# Patient Record
Sex: Male | Born: 1955 | Race: Black or African American | Hispanic: No | Smoking: Current every day smoker
Health system: Southern US, Community
[De-identification: ages and names within clinical notes are randomized; demographics above are authoritative.]

## PROBLEM LIST (undated history)

## (undated) DIAGNOSIS — E785 Hyperlipidemia, unspecified: Secondary | ICD-10-CM

## (undated) DIAGNOSIS — M1990 Unspecified osteoarthritis, unspecified site: Secondary | ICD-10-CM

## (undated) DIAGNOSIS — I2510 Atherosclerotic heart disease of native coronary artery without angina pectoris: Secondary | ICD-10-CM

## (undated) DIAGNOSIS — F329 Major depressive disorder, single episode, unspecified: Secondary | ICD-10-CM

## (undated) DIAGNOSIS — T7840XA Allergy, unspecified, initial encounter: Secondary | ICD-10-CM

## (undated) DIAGNOSIS — G894 Chronic pain syndrome: Secondary | ICD-10-CM

## (undated) DIAGNOSIS — I10 Essential (primary) hypertension: Secondary | ICD-10-CM

## (undated) DIAGNOSIS — F32A Depression, unspecified: Secondary | ICD-10-CM

## (undated) HISTORY — DX: Essential (primary) hypertension: I10

## (undated) HISTORY — DX: Chronic pain syndrome: G89.4

## (undated) HISTORY — PX: CARDIAC CATHETERIZATION: SHX172

## (undated) HISTORY — PX: JOINT REPLACEMENT: SHX530

## (undated) HISTORY — PX: CORONARY ANGIOPLASTY WITH STENT PLACEMENT: SHX49

## (undated) HISTORY — DX: Hyperlipidemia, unspecified: E78.5

## (undated) HISTORY — DX: Allergy, unspecified, initial encounter: T78.40XA

## (undated) HISTORY — DX: Major depressive disorder, single episode, unspecified: F32.9

## (undated) HISTORY — DX: Unspecified osteoarthritis, unspecified site: M19.90

## (undated) HISTORY — PX: SPINE SURGERY: SHX786

## (undated) HISTORY — PX: HEMORRHOID SURGERY: SHX153

## (undated) HISTORY — PX: BACK SURGERY: SHX140

## (undated) HISTORY — DX: Depression, unspecified: F32.A

---

## 1998-08-04 ENCOUNTER — Ambulatory Visit (HOSPITAL_COMMUNITY): Admission: RE | Admit: 1998-08-04 | Discharge: 1998-08-04 | Payer: Self-pay | Admitting: Pulmonary Disease

## 1998-08-04 ENCOUNTER — Encounter: Payer: Self-pay | Admitting: Pulmonary Disease

## 1998-09-30 ENCOUNTER — Emergency Department (HOSPITAL_COMMUNITY): Admission: EM | Admit: 1998-09-30 | Discharge: 1998-09-30 | Payer: Self-pay | Admitting: Emergency Medicine

## 1998-09-30 ENCOUNTER — Encounter: Payer: Self-pay | Admitting: Emergency Medicine

## 1999-03-11 ENCOUNTER — Ambulatory Visit (HOSPITAL_COMMUNITY): Admission: RE | Admit: 1999-03-11 | Discharge: 1999-03-11 | Payer: Self-pay | Admitting: Pulmonary Disease

## 1999-03-11 ENCOUNTER — Encounter: Payer: Self-pay | Admitting: Pulmonary Disease

## 2001-09-02 ENCOUNTER — Emergency Department (HOSPITAL_COMMUNITY): Admission: EM | Admit: 2001-09-02 | Discharge: 2001-09-02 | Payer: Self-pay | Admitting: Emergency Medicine

## 2001-11-27 ENCOUNTER — Emergency Department (HOSPITAL_COMMUNITY): Admission: EM | Admit: 2001-11-27 | Discharge: 2001-11-27 | Payer: Self-pay | Admitting: Emergency Medicine

## 2002-07-04 ENCOUNTER — Emergency Department (HOSPITAL_COMMUNITY): Admission: EM | Admit: 2002-07-04 | Discharge: 2002-07-04 | Payer: Self-pay | Admitting: Emergency Medicine

## 2002-07-04 ENCOUNTER — Encounter: Payer: Self-pay | Admitting: Emergency Medicine

## 2002-07-09 ENCOUNTER — Ambulatory Visit (HOSPITAL_COMMUNITY): Admission: RE | Admit: 2002-07-09 | Discharge: 2002-07-09 | Payer: Self-pay | Admitting: Internal Medicine

## 2002-07-09 ENCOUNTER — Encounter: Payer: Self-pay | Admitting: Internal Medicine

## 2002-07-14 ENCOUNTER — Inpatient Hospital Stay (HOSPITAL_COMMUNITY): Admission: EM | Admit: 2002-07-14 | Discharge: 2002-07-23 | Payer: Self-pay | Admitting: *Deleted

## 2002-07-14 ENCOUNTER — Emergency Department (HOSPITAL_COMMUNITY): Admission: EM | Admit: 2002-07-14 | Discharge: 2002-07-14 | Payer: Self-pay | Admitting: Emergency Medicine

## 2002-07-14 ENCOUNTER — Encounter: Payer: Self-pay | Admitting: Neurosurgery

## 2002-07-18 ENCOUNTER — Encounter: Payer: Self-pay | Admitting: Neurosurgery

## 2002-07-19 ENCOUNTER — Encounter: Payer: Self-pay | Admitting: Neurosurgery

## 2002-07-23 ENCOUNTER — Encounter: Payer: Self-pay | Admitting: Neurosurgery

## 2002-07-25 ENCOUNTER — Emergency Department (HOSPITAL_COMMUNITY): Admission: EM | Admit: 2002-07-25 | Discharge: 2002-07-25 | Payer: Self-pay | Admitting: Emergency Medicine

## 2002-08-24 DIAGNOSIS — Z9889 Other specified postprocedural states: Secondary | ICD-10-CM | POA: Insufficient documentation

## 2002-11-15 ENCOUNTER — Encounter: Admission: RE | Admit: 2002-11-15 | Discharge: 2002-11-15 | Payer: Self-pay | Admitting: Neurosurgery

## 2002-11-15 ENCOUNTER — Encounter: Payer: Self-pay | Admitting: Neurosurgery

## 2003-01-10 DIAGNOSIS — M810 Age-related osteoporosis without current pathological fracture: Secondary | ICD-10-CM | POA: Insufficient documentation

## 2003-02-07 DIAGNOSIS — F411 Generalized anxiety disorder: Secondary | ICD-10-CM | POA: Insufficient documentation

## 2003-03-09 ENCOUNTER — Ambulatory Visit (HOSPITAL_COMMUNITY): Admission: RE | Admit: 2003-03-09 | Discharge: 2003-03-09 | Payer: Self-pay | Admitting: Neurosurgery

## 2003-03-25 ENCOUNTER — Encounter: Admission: RE | Admit: 2003-03-25 | Discharge: 2003-03-25 | Payer: Self-pay | Admitting: Neurosurgery

## 2003-04-08 ENCOUNTER — Encounter: Admission: RE | Admit: 2003-04-08 | Discharge: 2003-04-08 | Payer: Self-pay | Admitting: Neurosurgery

## 2003-05-02 ENCOUNTER — Ambulatory Visit (HOSPITAL_COMMUNITY): Admission: RE | Admit: 2003-05-02 | Discharge: 2003-05-02 | Payer: Self-pay | Admitting: Neurosurgery

## 2003-05-10 DIAGNOSIS — F528 Other sexual dysfunction not due to a substance or known physiological condition: Secondary | ICD-10-CM

## 2004-09-09 ENCOUNTER — Encounter: Admission: RE | Admit: 2004-09-09 | Discharge: 2004-09-09 | Payer: Self-pay | Admitting: Family Medicine

## 2005-04-25 DIAGNOSIS — Z96659 Presence of unspecified artificial knee joint: Secondary | ICD-10-CM

## 2005-10-17 ENCOUNTER — Encounter: Admission: RE | Admit: 2005-10-17 | Discharge: 2005-10-17 | Payer: Self-pay | Admitting: Endocrinology

## 2005-11-23 ENCOUNTER — Ambulatory Visit (HOSPITAL_COMMUNITY): Admission: RE | Admit: 2005-11-23 | Discharge: 2005-11-23 | Payer: Self-pay | Admitting: Orthopedic Surgery

## 2006-01-12 ENCOUNTER — Ambulatory Visit (HOSPITAL_BASED_OUTPATIENT_CLINIC_OR_DEPARTMENT_OTHER): Admission: RE | Admit: 2006-01-12 | Discharge: 2006-01-12 | Payer: Self-pay | Admitting: Orthopedic Surgery

## 2006-07-26 ENCOUNTER — Ambulatory Visit (HOSPITAL_COMMUNITY): Admission: RE | Admit: 2006-07-26 | Discharge: 2006-07-26 | Payer: Self-pay | Admitting: Orthopedic Surgery

## 2006-08-30 ENCOUNTER — Ambulatory Visit (HOSPITAL_BASED_OUTPATIENT_CLINIC_OR_DEPARTMENT_OTHER): Admission: RE | Admit: 2006-08-30 | Discharge: 2006-08-30 | Payer: Self-pay | Admitting: Orthopedic Surgery

## 2007-03-19 ENCOUNTER — Inpatient Hospital Stay (HOSPITAL_COMMUNITY): Admission: RE | Admit: 2007-03-19 | Discharge: 2007-03-25 | Payer: Self-pay | Admitting: Orthopedic Surgery

## 2007-07-17 ENCOUNTER — Emergency Department (HOSPITAL_COMMUNITY): Admission: EM | Admit: 2007-07-17 | Discharge: 2007-07-17 | Payer: Self-pay | Admitting: Emergency Medicine

## 2007-07-30 ENCOUNTER — Encounter (INDEPENDENT_AMBULATORY_CARE_PROVIDER_SITE_OTHER): Payer: Self-pay | Admitting: Nurse Practitioner

## 2007-07-30 LAB — CONVERTED CEMR LAB
Cholesterol: 217 mg/dL
HDL: 53 mg/dL
LDL Cholesterol: 134 mg/dL
Triglycerides: 150 mg/dL

## 2007-07-31 ENCOUNTER — Encounter (INDEPENDENT_AMBULATORY_CARE_PROVIDER_SITE_OTHER): Payer: Self-pay | Admitting: Nurse Practitioner

## 2007-07-31 LAB — CONVERTED CEMR LAB
ALT: 28 units/L
Calcium: 9.5 mg/dL
Total Bilirubin: 0.4 mg/dL
Total Protein: 7.4 g/dL

## 2007-10-04 ENCOUNTER — Encounter
Admission: RE | Admit: 2007-10-04 | Discharge: 2007-10-05 | Payer: Self-pay | Admitting: Physical Medicine & Rehabilitation

## 2007-10-05 ENCOUNTER — Ambulatory Visit: Payer: Self-pay | Admitting: Physical Medicine & Rehabilitation

## 2008-01-02 ENCOUNTER — Emergency Department (HOSPITAL_COMMUNITY): Admission: EM | Admit: 2008-01-02 | Discharge: 2008-01-02 | Payer: Self-pay | Admitting: Emergency Medicine

## 2008-06-14 ENCOUNTER — Emergency Department (HOSPITAL_COMMUNITY): Admission: EM | Admit: 2008-06-14 | Discharge: 2008-06-14 | Payer: Self-pay | Admitting: Emergency Medicine

## 2008-06-18 ENCOUNTER — Ambulatory Visit: Payer: Self-pay | Admitting: Nurse Practitioner

## 2008-06-18 DIAGNOSIS — F17200 Nicotine dependence, unspecified, uncomplicated: Secondary | ICD-10-CM | POA: Insufficient documentation

## 2008-06-18 DIAGNOSIS — G479 Sleep disorder, unspecified: Secondary | ICD-10-CM | POA: Insufficient documentation

## 2008-06-18 DIAGNOSIS — M549 Dorsalgia, unspecified: Secondary | ICD-10-CM | POA: Insufficient documentation

## 2008-07-01 ENCOUNTER — Encounter (INDEPENDENT_AMBULATORY_CARE_PROVIDER_SITE_OTHER): Payer: Self-pay | Admitting: Nurse Practitioner

## 2008-07-09 ENCOUNTER — Inpatient Hospital Stay (HOSPITAL_COMMUNITY): Admission: EM | Admit: 2008-07-09 | Discharge: 2008-07-12 | Payer: Self-pay | Admitting: Emergency Medicine

## 2008-07-09 ENCOUNTER — Ambulatory Visit: Payer: Self-pay | Admitting: Family Medicine

## 2008-07-11 DIAGNOSIS — K219 Gastro-esophageal reflux disease without esophagitis: Secondary | ICD-10-CM

## 2008-07-11 DIAGNOSIS — R1310 Dysphagia, unspecified: Secondary | ICD-10-CM | POA: Insufficient documentation

## 2008-07-18 ENCOUNTER — Encounter (INDEPENDENT_AMBULATORY_CARE_PROVIDER_SITE_OTHER): Payer: Self-pay | Admitting: Nurse Practitioner

## 2008-07-21 ENCOUNTER — Ambulatory Visit (HOSPITAL_COMMUNITY): Admission: RE | Admit: 2008-07-21 | Discharge: 2008-07-21 | Payer: Self-pay | Admitting: Nephrology

## 2008-08-11 ENCOUNTER — Encounter (INDEPENDENT_AMBULATORY_CARE_PROVIDER_SITE_OTHER): Payer: Self-pay | Admitting: Nurse Practitioner

## 2008-08-14 ENCOUNTER — Ambulatory Visit (HOSPITAL_COMMUNITY): Admission: RE | Admit: 2008-08-14 | Discharge: 2008-08-14 | Payer: Self-pay | Admitting: Neurosurgery

## 2008-08-20 ENCOUNTER — Encounter (INDEPENDENT_AMBULATORY_CARE_PROVIDER_SITE_OTHER): Payer: Self-pay | Admitting: Nurse Practitioner

## 2008-08-20 DIAGNOSIS — E7800 Pure hypercholesterolemia, unspecified: Secondary | ICD-10-CM | POA: Insufficient documentation

## 2008-08-21 ENCOUNTER — Encounter (INDEPENDENT_AMBULATORY_CARE_PROVIDER_SITE_OTHER): Payer: Self-pay | Admitting: Nurse Practitioner

## 2008-08-25 ENCOUNTER — Encounter: Admission: RE | Admit: 2008-08-25 | Discharge: 2008-09-09 | Payer: Self-pay | Admitting: Neurosurgery

## 2008-09-01 ENCOUNTER — Ambulatory Visit: Payer: Self-pay | Admitting: Nurse Practitioner

## 2008-09-01 DIAGNOSIS — E669 Obesity, unspecified: Secondary | ICD-10-CM | POA: Insufficient documentation

## 2008-09-10 ENCOUNTER — Encounter (INDEPENDENT_AMBULATORY_CARE_PROVIDER_SITE_OTHER): Payer: Self-pay | Admitting: Nurse Practitioner

## 2008-09-15 ENCOUNTER — Ambulatory Visit: Payer: Self-pay | Admitting: Nurse Practitioner

## 2008-09-15 LAB — CONVERTED CEMR LAB
ALT: 24 units/L (ref 0–53)
Albumin: 4.1 g/dL (ref 3.5–5.2)
Basophils Relative: 1 % (ref 0–1)
CO2: 24 meq/L (ref 19–32)
Cholesterol: 200 mg/dL (ref 0–200)
Glucose, Bld: 105 mg/dL — ABNORMAL HIGH (ref 70–99)
Hemoglobin: 14 g/dL (ref 13.0–17.0)
LDL Cholesterol: 125 mg/dL — ABNORMAL HIGH (ref 0–99)
Lymphocytes Relative: 34 % (ref 12–46)
Lymphs Abs: 1.4 10*3/uL (ref 0.7–4.0)
MCHC: 32.3 g/dL (ref 30.0–36.0)
Monocytes Absolute: 0.6 10*3/uL (ref 0.1–1.0)
Monocytes Relative: 15 % — ABNORMAL HIGH (ref 3–12)
Neutro Abs: 1.8 10*3/uL (ref 1.7–7.7)
Neutrophils Relative %: 43 % (ref 43–77)
Potassium: 4.1 meq/L (ref 3.5–5.3)
RBC: 4.63 M/uL (ref 4.22–5.81)
Sodium: 142 meq/L (ref 135–145)
Total Bilirubin: 0.2 mg/dL — ABNORMAL LOW (ref 0.3–1.2)
Total Protein: 6.6 g/dL (ref 6.0–8.3)
Triglycerides: 93 mg/dL (ref <150)
VLDL: 19 mg/dL (ref 0–40)
WBC: 4.2 10*3/uL (ref 4.0–10.5)

## 2008-09-16 ENCOUNTER — Encounter (INDEPENDENT_AMBULATORY_CARE_PROVIDER_SITE_OTHER): Payer: Self-pay | Admitting: Nurse Practitioner

## 2008-09-29 ENCOUNTER — Encounter (INDEPENDENT_AMBULATORY_CARE_PROVIDER_SITE_OTHER): Payer: Self-pay | Admitting: Nurse Practitioner

## 2008-10-07 ENCOUNTER — Encounter: Admission: RE | Admit: 2008-10-07 | Discharge: 2008-10-07 | Payer: Self-pay | Admitting: Neurosurgery

## 2008-10-09 ENCOUNTER — Telehealth (INDEPENDENT_AMBULATORY_CARE_PROVIDER_SITE_OTHER): Payer: Self-pay | Admitting: Nurse Practitioner

## 2008-10-29 ENCOUNTER — Encounter (INDEPENDENT_AMBULATORY_CARE_PROVIDER_SITE_OTHER): Payer: Self-pay | Admitting: Nurse Practitioner

## 2008-11-21 ENCOUNTER — Encounter (INDEPENDENT_AMBULATORY_CARE_PROVIDER_SITE_OTHER): Payer: Self-pay | Admitting: Nurse Practitioner

## 2009-02-02 ENCOUNTER — Ambulatory Visit: Payer: Self-pay | Admitting: Nurse Practitioner

## 2009-02-04 LAB — CONVERTED CEMR LAB
Cholesterol: 224 mg/dL — ABNORMAL HIGH (ref 0–200)
Total CHOL/HDL Ratio: 4.2
Triglycerides: 95 mg/dL (ref <150)

## 2009-05-13 ENCOUNTER — Encounter (INDEPENDENT_AMBULATORY_CARE_PROVIDER_SITE_OTHER): Payer: Self-pay | Admitting: Nurse Practitioner

## 2009-05-28 ENCOUNTER — Encounter (INDEPENDENT_AMBULATORY_CARE_PROVIDER_SITE_OTHER): Payer: Self-pay | Admitting: Nurse Practitioner

## 2010-04-30 ENCOUNTER — Encounter (INDEPENDENT_AMBULATORY_CARE_PROVIDER_SITE_OTHER): Payer: Self-pay | Admitting: Nurse Practitioner

## 2010-05-15 ENCOUNTER — Encounter: Payer: Self-pay | Admitting: Neurosurgery

## 2010-05-16 ENCOUNTER — Encounter: Payer: Self-pay | Admitting: Family Medicine

## 2010-05-16 ENCOUNTER — Encounter: Payer: Self-pay | Admitting: Endocrinology

## 2010-05-25 NOTE — Letter (Signed)
Summary: POS-T-VAC//DENIED//FAXED  POS-T-VAC//DENIED//FAXED   Imported By: Arta Bruce 05/28/2009 09:39:10  _____________________________________________________________________  External Attachment:    Type:   Image     Comment:   External Document

## 2010-05-25 NOTE — Miscellaneous (Signed)
Summary: Med change  Clinical Lists Changes  Medications: Changed medication from PERCOCET 7.5-325 MG TABS (OXYCODONE-ACETAMINOPHEN) 1 tablet by mouth two times a day as needed for pain to PERCOCET 7.5-325 MG TABS (OXYCODONE-ACETAMINOPHEN) NO MORE NARCS IN THIS OFFICE

## 2010-05-27 NOTE — Letter (Signed)
Summary: [facility]  [facility]   Imported By: Arta Bruce 05/07/2010 15:04:00  _____________________________________________________________________  External Attachment:    Type:   Image     Comment:   External Document

## 2010-08-05 LAB — BASIC METABOLIC PANEL
BUN: 19 mg/dL (ref 6–23)
Calcium: 9.6 mg/dL (ref 8.4–10.5)
Creatinine, Ser: 1.14 mg/dL (ref 0.4–1.5)
GFR calc Af Amer: >60 mL/min (ref >60)
GFR calc non Af Amer: >60 mL/min (ref >60)
GFR calc non Af Amer: >60 mL/min (ref >60)
Potassium: 3.7 mEq/L (ref 3.5–5.1)
Sodium: 136 mEq/L (ref 135–145)

## 2010-08-05 LAB — LIPID PANEL
LDL Cholesterol: 119 mg/dL — ABNORMAL HIGH (ref 0–99)
Triglycerides: 64 mg/dL (ref <150)
VLDL: 13 mg/dL (ref 0–40)

## 2010-08-05 LAB — CBC
HCT: 38.7 % — ABNORMAL LOW (ref 39.0–52.0)
Hemoglobin: 13.3 g/dL (ref 13.0–17.0)
MCHC: 34.3 g/dL (ref 30.0–36.0)
MCV: 93.8 fL (ref 78.0–100.0)
Platelets: 185 10*3/uL (ref 150–400)
Platelets: 238 10*3/uL (ref 150–400)
RBC: 4.77 MIL/uL (ref 4.22–5.81)
RDW: 13.2 % (ref 11.5–15.5)
WBC: 8 10*3/uL (ref 4.0–10.5)

## 2010-08-05 LAB — RAPID URINE DRUG SCREEN, HOSP PERFORMED
Amphetamines: NOT DETECTED
Cocaine: POSITIVE — AB
Opiates: NOT DETECTED
Tetrahydrocannabinol: NOT DETECTED

## 2010-08-05 LAB — DIFFERENTIAL
Eosinophils Absolute: 0 10*3/uL (ref 0.0–0.7)
Lymphocytes Relative: 13 % (ref 12–46)
Lymphs Abs: 1 10*3/uL (ref 0.7–4.0)
Neutro Abs: 6.2 10*3/uL (ref 1.7–7.7)
Neutrophils Relative %: 78 % — ABNORMAL HIGH (ref 43–77)

## 2010-08-05 LAB — HEPARIN LEVEL (UNFRACTIONATED): Heparin Unfractionated: 0.32 IU/mL (ref 0.30–0.70)

## 2010-08-05 LAB — COMPREHENSIVE METABOLIC PANEL
Albumin: 3.5 g/dL (ref 3.5–5.2)
BUN: 16 mg/dL (ref 6–23)
Chloride: 106 mEq/L (ref 96–112)
Creatinine, Ser: 0.97 mg/dL (ref 0.4–1.5)
GFR calc non Af Amer: >60 mL/min (ref >60)
Total Bilirubin: 0.8 mg/dL (ref 0.3–1.2)

## 2010-08-05 LAB — CARDIAC PANEL(CRET KIN+CKTOT+MB+TROPI)
CK, MB: 2 ng/mL (ref 0.3–4.0)
Relative Index: 1.6 (ref 0.0–2.5)
Relative Index: 2.1 (ref 0.0–2.5)
Relative Index: 2.8 — ABNORMAL HIGH (ref 0.0–2.5)
Total CK: 129 U/L (ref 7–232)
Total CK: 214 U/L (ref 7–232)
Troponin I: <0.01 ng/mL (ref 0.00–0.06)
Troponin I: <0.01 ng/mL (ref 0.00–0.06)

## 2010-08-05 LAB — POCT CARDIAC MARKERS
CKMB, poc: 8 ng/mL (ref 1.0–8.0)
Myoglobin, poc: 221 ng/mL (ref 12–200)
Troponin i, poc: <0.05 ng/mL (ref 0.00–0.09)

## 2010-08-05 LAB — TSH: TSH: 0.366 u[IU]/mL (ref 0.350–4.500)

## 2010-08-05 LAB — ETHANOL: Alcohol, Ethyl (B): <5 mg/dL (ref 0–10)

## 2010-08-05 LAB — HIV ANTIBODY (ROUTINE TESTING W REFLEX): HIV: NONREACTIVE

## 2010-09-07 NOTE — H&P (Signed)
NAME:  Jeremiah Goodwin, Jeremiah Goodwin NO.:  [id number]   MEDICAL RECORD NO.:  [id number]          PATIENT TYPE:  INP   LOCATION:  2024                         FACILITY:  [facility]   PHYSICIAN:  Leighton Roach McDiarmid, M.D.DATE OF BIRTH:  1956/03/09   DATE OF ADMISSION:  07/09/2008  DATE OF DISCHARGE:                              HISTORY & PHYSICAL   PRIMARY CARE PHYSICIAN:  [facility].   PRIMARY ORTHOPEDICS:  [facility].   PRIMARY PAIN DOCTOR:  Dr. Marca Ancona.   PRIMARY NEUROSURGEON:  Danae Orleans. Venetia Maxon, M.D.   CHIEF COMPLAINT:  Chest pain.   HISTORY OF PRESENT ILLNESS:  This is a 55 year old male who began having  chest pain at approximately 2-3 a.m. on the morning of admission, but  then did not tell his wife until approximately 10:45 a.m., who called  EMS.  Chest pain is described as heavy weights and pressure.  It is  substernal in location.  It has not been relieved by nitroglycerin and  morphine in the ER.  He had been working hard, moving into a new  property, just prior to the pain and was resting on the floor when the  pain started.  He had never had pain like this before.  He tried  changing physicians to relieve his pain, but did not have any relief.  He did say lying flat made it worse, however.  He did have some nausea  and vomiting associated with the pain and felt cold, but denied any  diaphoresis or sweating.  The pain did not radiate anywhere.  It was not  reportedly associated with the food.   PAST MEDICAL HISTORY:  1. Back pain, chronic.  2. Osteoporosis.  3. GERD.  4. Hyperlipidemia.  5. Insomnia.  6. Leg pain.   PAST SURGICAL HISTORY:  Lumbar fusion in 1990s, cervical fusion in the  2000s, decompressive laminectomy in 2004 of the lumbar spine, a left  total knee replacement, and multiple knee surgeries.   SOCIAL HISTORY:  The patient currently lives with his ex-wife.  He  smokes 1-1/2 packs per day and has since approximately age 40.  He  drinks fairly heavily, drinking about half-pint to a pint on most days  of the week and occasionally a beer with that.  He initially denied drug  use, but once his wife left he did admit to cocaine use.  He has been on  disability since 2005 due to his chronic back pain.  He has 2 children  himself and he has 4 children through his wife.   FAMILY HISTORY:  His mother had type 2 diabetes and breast cancer.  His  father was an alcoholic.  In his siblings, 1 has type 2 diabetes.   CURRENT MEDICATIONS:  1. Endocet 5/325 p.o. t.i.d. per Dr. Marca Ancona.  2. Lovaza 1 g p.o. q.i.d.  3. Calcium plus vitamin D 600/400 p.o. daily.  4. Vitamin D 2000 International Units p.o. daily.  5. Multivitamin p.o. daily.  6. Ibuprofen 200 mg p.o. p.r.n.  7. Percocet 7.5/325 p.o. b.i.d. p.r.n.  8. Amitriptyline 25 mg p.o. nightly.  9. Neurontin 300 mg p.o. t.i.d.  10.Boniva as directed.  11.Tizanidine 4 mg p.o. t.i.d.   ALLERGIES:  VICODIN CAUSES ITCHING.   REVIEW OF SYSTEMS:  Positive for HPI and for increasing pain with hiccup  and sneeze and increasing shortness of breath for the past few days.  Otherwise as per HPI or is negative greater than 12 points.   PHYSICAL EXAMINATION:  VITAL SIGNS:  Temperature 97.4, pulse 97-110,  respirations 19, blood pressure 123-132/60s-70s, pulse ox 97% on room  air.  GENERAL:  This is an obese, African American male, lying on the bed,  wincing occasionally in pain.  HEENT:  Normocephalic, atraumatic with pupils equal, round, reactive to  light.  Extraocular muscles intact.  Moist mucous membranes.  No  pharyngeal erythema, edema, or exudates.  No lymphadenopathy.  CARDIOVASCULAR:  Normal S1 and S2 with no murmurs, rubs, or gallops.  LUNGS:  Crackles at the right lower lobe, but is otherwise clear with  acute breath in all lung fields.  ABDOMEN:  Soft, nontender except for an epigastric region.  There are no  masses palpable.  No hepatosplenomegaly palpable.  There  are positive  bowel sounds.  BACK:  Incisions from previous surgeries to cervical spine and lumbar  spine.  EXTREMITIES:  No edema and 2+ peripheral pulses.  NEUROLOGIC:  Cranial nerves II through XII are grossly intact, and  otherwise neurologic exam is grossly intact.  SKIN:  No rashes or jaundice.   LABORATORY DATA:  Point of care cardiac enzymes reveal CK-MB 8, troponin  I less than 0.05, myoglobin 22.9.  CBC reveals white blood cell count of  8.0, hemoglobin 15.1, hematocrit 44.4, platelet count 238 with 78%  neutrophils.  UDS is positive for cocaine.  BMET reveals sodium 140,  potassium 4.1, chloride 105, bicarb 22, BUN 19, creatinine 1.14, glucose  90, and calcium 9.6.   STUDIES:  Chest x-ray reveals likely atelectasis, which is new in the  left base.  EKG is unchanged from 2008 and shows normal sinus rhythm  with poor R-wave progression, right axis deviation, and no T-wave or ST-  segment abnormalities.   ASSESSMENT:  This is a 55 year old gentleman with history of chronic  back pain and tobacco abuse, who presents with chest pain.   PLAN:  1. Chest pain.  This appears to be atypical angina at this point.  We      will admit to rule out acute MI.  We will cycle his cardiac enzymes      x2 more sets every 8 hours apart and repeat an EKG in the a.m.  We      will place on telemetry to monitor for any arrhythmias.  His risk      factors include his smoking and possible hyperlipidemia since he is      on Lovaza.  We will stratify him with an A1c, fasting lipid panel,      and TSH.  His UDS was positive for cocaine, and he was confronted      about this in the emergency room and consulted on the fact that he      needs to abstain.  If more risk factors develop, he may require an      outpatient stress test.  If he needs a cardiologist during this      hospital stay, he would prefer [facility].  Because this      chest pain is atypical, we will try treating it as if it  is a GI      process as well with a GI cocktail and PPI.  2. Polysubstance abuse.  He has positive UDS for cocaine and he      endorses one and half packs per day of cigarettes and greater than      1 pink of drink the night before his admission.  He denies any      history of binging or DTs.  We will provide cessation counseling      for all of the above for the patient.  3. Chronic back pain.  We will continue Percocet, Neurontin, and      morphine.  These are per his home regimen with the exception of the      morphine, which will also help if this is true chest pain from a      cardiac standpoint.  Given that we are ruling out cardiac      standpoint, we will hold on the nicotine patch while he is in      house.  4. Question of hyperlipidemia.  We will continue his Lovaza and check      a fasting lipid panel.  5. Fluid, electrolytes, nutrition, and gastrointestinal.  We will Hep-      Lock his peripheral IV and provide heart-healthy diet.  6. Prophylaxis.  We will provide Protonix and heparin.  7. Disposition is hopeful for about 24-hour observation if there is a      complete rule out.      Ancil Boozer, MD  Electronically Signed      Leighton Roach McDiarmid, M.D.  Electronically Signed    SA/MEDQ  D:  07/11/2008  T:  07/12/2008  Job:  [id number]

## 2010-09-07 NOTE — Consult Note (Signed)
NAME:  Jeremiah Goodwin, RICHES NO.:  [id number]   MEDICAL RECORD NO.:  [id number]          PATIENT TYPE:  INP   LOCATION:  2024                         FACILITY:  [facility]   PHYSICIAN:  John C. Madilyn Fireman, M.D.    DATE OF BIRTH:  16-Jan-1956   DATE OF CONSULTATION:  07/11/2008  DATE OF DISCHARGE:                                 CONSULTATION   REASON FOR CONSULTATION:  Chest pain and odynophagia.   HISTORY OF PRESENT ILLNESS:  The patient is a 55 year old white male who  presents with the onset of chest pain which, since his hospitalization,  has transitioned over more to odynophagia for liquids and solids with  resolution of constant chest pain.  He gives me a history of having had  some exposure to Easy-Off oven cleaner and Clorox after work, also using  cocaine and alcohol and at some point after that stated he could not  move his legs and sat on the floor overnight, could not reach his cell  phone and sometime during that time began having his chest pain.  He  eventually reached somebody and EMS brought him to the hospital where he  has had a cardiac consultation with negative cardiac etiology found.  During the time he has been here his symptoms have transitioned more to  odynophagia in the upper retrosternal area, this occurs for both solids  and liquids.  This overall seems to be slightly improved.  He has been  started on a proton pump inhibitor since he has been here.  He has never  had any similar symptoms and denies any antecedent gastroesophageal  reflux.  He denies taking any particular pills incriminated in causing  pill-induced esophagitis such as doxycycline, ibuprofen or  bisphosphonate.   PAST MEDICAL HISTORY:  1. Osteoporosis.  2. Hyperlipidemia.   PAST SURGICAL HISTORY:  1. Lumbar fusion in the 1990s.  2. Decompressive lumbar laminectomy.  3. Knee replacement.   FAMILY HISTORY:  Noncontributory.   SOCIAL HISTORY:  Uses cocaine.  Drinks some alcohol.   Lives with wife  and 2 children.   PHYSICAL EXAM:  A well-developed, well-nourished pleasant black male in  no acute distress.  HEART:  Regular rate and rhythm, without murmur.  LUNGS:  Clear.  ABDOMEN:  Soft, nondistended with normoactive bowel sounds.  No  hepatosplenomegaly, mass or guarding.  OROPHARYNGEAL:  Unremarkable.   IMPRESSION:  Chest pain, with unusual symptoms at onset, transitioning  over to odynophagia, etiology not apparent.   PLAN:  1. Continue proton pump inhibitor.  2. Will add Carafate elixir and if symptoms do not improve steadily      consider esophagoscopy.           ______________________________  Everardo All. Madilyn Fireman, M.D.     JCH/MEDQ  D:  07/11/2008  T:  07/11/2008  Job:  [id number]   cc:   Leighton Roach McDiarmid, M.D.

## 2010-09-07 NOTE — H&P (Signed)
NAME:  Jeremiah Goodwin, Jeremiah Goodwin NO.:  [id number]   MEDICAL RECORD NO.:  [id number]          PATIENT TYPE:  INP   LOCATION:  NA                           FACILITY:  [facility]   PHYSICIAN:  Ollen Gross, M.D.    DATE OF BIRTH:  [birth year]   DATE OF ADMISSION:  03/19/2007  DATE OF DISCHARGE:                              HISTORY & PHYSICAL   DATE OF OFFICE VISIT AND HISTORY AND PHYSICAL:  March 15, 2007.   CHIEF COMPLAINT:  Left knee pain.   HISTORY OF PRESENT ILLNESS:  The patient is a 55 year old male who has  been seen by Dr. Lequita Halt for ongoing knee problems.  He has known  arthritis.  He has been treated conservatively in the past for his knee  problems with injections.  He also actually has undergone a left knee  arthroscopy earlier this year without benefit.  He has reached a point  now where he could benefit from undergoing surgical intervention.  Risks  and benefits have been discussed, and he elected to proceed with  surgery.   ALLERGIES:  VICODIN causes itching (the patient is able to take  Percocet).   CURRENT MEDICATIONS:  1. Percocet 10.  2. Lipitor.  3. Fish oil.  4. Vitamin D.  5. Boniva.  6. Aleve.   PAST MEDICAL HISTORY:  Elevated cholesterol.   PAST SURGICAL HISTORY:  1. Lumbar surgery 6 or 7 times.  2. Left knee surgery x2.   SOCIAL HISTORY:  One-half pack per day smoker.  One pint of alcohol per  week.  Four children, two stepchildren.  Lives with his mother.  Bedroom  is upstairs.  Hs a full flight of stairs going up.   FAMILY HISTORY:  Diabetes and cancer.   REVIEW OF SYSTEMS:  GENERAL:  No fevers, chills, or night sweats.  NEUROLOGIC:  No seizures, syncope, or paralysis.  RESPIRATORY:  No  shortness of breath, productive cough, or hemoptysis.  CARDIOVASCULAR:  No chest pain, angina, or orthopnea.  GI:  No nausea, vomiting,  diarrhea, or constipation.  GU: No dysuria, hematuria, or discharge.  MUSCULOSKELETAL:  Left knee.   PHYSICAL  EXAMINATION:  VITAL SIGNS:  Pulse 68, respirations 12, blood  pressure 118/78.  GENERAL:  A 55 year old [place] American male, well nourished, well  developed, in no acute distress, alert and cooperative.  He is  accompanied by his mother.  HEENT:  Normocephalic, atraumatic.  Pupils round, reactive.  Oropharynx  clear.  EOMs intact.  NECK:  Supple.  CHEST:  Clear anterior posterior chest walls.  No rhonchi, rales, or  wheezing.  HEART:  Regular rate and rhythm.  No murmur.  S1, S2 noted.  ABDOMEN:  Soft, nontender.  Bowel sounds present, slightly round.  RECTAL/BREASTS/GENITALIA:  Not done.  Not pertinent to present illness.  EXTREMITIES:  Left knee shows moderate fusion.  Motor function intact.  Sensation intact.   IMPRESSION:  Osteoarthritis left knee.   PLAN:  The patient will be admitted to [facility] to go a left  total knee replacement arthroplasty.  Surgery will be  performed by Dr.  Ollen Gross.      Alexzandrew L. Perkins, P.A.C.      Ollen Gross, M.D.  Electronically Signed    ALP/MEDQ  D:  03/18/2007  T:  03/19/2007  Job:  [id number]   cc:   Alexia Freestone, M.D.

## 2010-09-07 NOTE — Group Therapy Note (Signed)
[name] is a 55 year old male.  Consult requested for the  evaluation of chronic low back pain as well as knee pain.   CHIEF COMPLAINT:  Back pain, lower extremity pain, as well as neck pain,  going on for about 10 years.   A 55 year old male, he has a long history of spine problems.  He has had  what sounds like anterior cervical decompression and fusion, per Dr.  Phoebe Goodwin in the late 1990s.  He has had a lumbar fusion per Dr. Venetia Goodwin,  some time around year 2000, L5-S1 level.  Then he had a decompressive  laminectomy in 2004 at L4 level, per Dr. Venetia Goodwin and then a spinal cord  stim trial in 2005, which he did not tolerate and was not helpful for  him.  More recently, he has had a left total knee replacement per Dr.  Ollen Goodwin on March 19, 2007.  He has had some postoperative knee  pain.   He has followed with Dr. Lacie Goodwin, a family physician in [facility]  despite the fact he lived in [place].  He was receiving Percocet  10/325 and was taking 240 tablets per month.  In addition, he was taking  Xanax 1 p.o. b.i.d., and Ultram ER 200 mg per day.  Because Dr. Lacie Goodwin  lost his license to prescribe narcotic analgesics due to various issues,  he was looking for another physician.  He moved to the [place] area  and established contact with Dr. Lemar Goodwin who referred him on, for pain  management evaluation.   Patient has not had any physical therapy.  He has considered aquatic  therapy and discussed this with Dr. Lequita Goodwin per his report.  Dr.  Lequita Goodwin felt he was not ready for this yet.  Patient is approximately 7-  month postop total knee.  His average pain is 8-9/10.  Pain limits his  activity at a moderate level.  He is independent with all his self-care  and mobility.  He walked 20 minutes at time.  He drives.  He does some  yard work around [employer].   He has been on disability since around 2005.   REVIEW OF SYSTEM:  Numbness, depression, and anxiety.  He does  not see a  psychiatrist.   SOCIAL HISTORY:  Divorced.  Smoker, half a pack a day.  Denies illicit  drugs.  Denies any alcohol abuse.  He is back together with his wife who  was the one who originally told him to go to Dr. Lacie Goodwin.  She has also  recently had back surgery.  He reportedly has done okay in the past with  some of his localized back pain.  More recently, he states that his pain  medicinal dosage was reduced to 1 tablet at a time, taking it about 4  times a day.   OTHER PAST MEDICAL HISTORY:  Positive for osteoporosis.  He really does  not know why he has it.  He denies any history of frequent prednisone  usage.  He has GERD.  He does have arthritis.Marland Kitchen   EXAMINATION:  Well-developed, well-nourished male in no acute distress.  Blood pressure 132/81, pulse 81, respiration 18, oxygen saturation 99%  on room air.   Mood and affect appear appropriate.  His gait is normal.  He is able to  toe walk and heel walk.  His neck range of motion is about 50% rotation  and 25% forward flexion and extension.  His motor strength is 5/5 in  bilateral deltoid, biceps, and grip.  His right triceps is essentially  1, left is 5.  He has significant muscle atrophy in the right triceps.   The lower extremity strength is 5/5, bilateral hip flexion, knee  extension, and ankle dorsiflexion.  He has healed knee incision on the  left side.  No evidence of knee effusion.  His upper and lower extremity  pulses are normal.  No evidence of peripheral edema.   His coordination is normal, sensation is normal bilateral upper and  lower extremity with the exception in the right C7 dermatome.  This is  reduced on the right side and left side.   IMPRESSION:  1. Cervical postlaminectomy syndrome.  2. Lumbar postlaminectomy syndrome and have some component sacroiliac      problems versus facet, many years post fusion.  3. Right C7 radiculopathy with muscle atrophy.   PLAN:  1. We will send a  fitness-focused program of physical therapy.  2. Urine drug screen.  If negative for illicit drugs, nondisclosed      opiates or nondisclosed benzodiazepines,  would be able to assume      his Percocet prescription versus switch to hydrocodone.  He      certainly would not go back up to 240 tablet per month he was on.      He actually seem to do relatively well on a couple of tablets per      day and is for activity related pain.  3. Lidoderm patch use on low back as well as left knee.  4. I think that long-term basis, he should do well with community      walking program as well as quad exercise program to be done in the      community.  5. Start him on tramadol.  He was on Ultram ER.  I think he should be      able to take the shorter-acting dosage.   Thank you for this interesting consultation. We will keep you appraise  of the situation.   May consider a psychology evaluation for coping mechanisms, chronic  pain.      Jeremiah Goodwin, M.D.  Electronically Signed     AEK/MedQ  D:  10/05/2007 17:11:36  T:  10/06/2007 16:10:96  Job #:  [id number]   cc:   Jeremiah Goodwin. Jeremiah Goodwin, M.D.  Fax: [phone]   Jeremiah Goodwin, M.D.  Fax: [phone]   Jeremiah Goodwin, M.D.  Fax: [phone]

## 2010-09-07 NOTE — Op Note (Signed)
NAME:  Jeremiah Goodwin NO.:  [id number]   MEDICAL RECORD NO.:  [id number]          PATIENT TYPE:  INP   LOCATION:  1610                         FACILITY:  [facility]   PHYSICIAN:  Ollen Gross, M.D.    DATE OF BIRTH:  Jul 23, 1955   DATE OF PROCEDURE:  03/19/2007  DATE OF DISCHARGE:                               OPERATIVE REPORT   PREOPERATIVE DIAGNOSIS:  Osteoarthritis, left knee.   POSTOPERATIVE DIAGNOSIS:  Osteoarthritis, left knee.   PROCEDURE:  Left total knee arthroplasty.   SURGEON:  Ollen Gross, M.D.   ASSISTANT:  Alexzandrew L. Perkins, P.A.-C.   ANESTHESIA:  General with postop Marcaine pain pump.   ESTIMATED BLOOD LOSS:  Minimal.   DRAINS:  None.   TOURNIQUET TIME:  36 minutes at 300 mmHg.   COMPLICATIONS:  None.   CONDITION.:  Stable to recovery.   BRIEF CLINICAL NOTE:  Jeremiah Goodwin is a 55 year old male who has end-stage  medial compartment arthritis of the left knee.  He has had previous  arthroscopies and has had progressive cartilage loss and dysfunction.  Has has had intractable pain.  He presents now for total knee  arthroplasty after failure of nonoperative management.   PROCEDURE IN DETAIL:  After the successful administration of a general  anesthetic, a tourniquet is placed high on the left thigh and the left  lower extremity prepped and draped in the usual sterile fashion.  The  extremity is wrapped in an Esmarch, the knee flexed, tourniquet inflated  to 300 mmHg.  A midline incision made with a 10 blade through  subcutaneous tissue to the level of the extensor mechanism.  A fresh  blade is used to make a medial parapatellar arthrotomy.  Soft tissue on  the proximal medial tibia is subperiosteally elevated to the joint line  with a knife and into the semimembranosus bursa with a Cobb elevator.  Soft tissue laterally is elevated with attention being paid to avoid the  patellar tendon on the tibial tubercle.  The patella is subluxed  laterally, knee flexed 90 degrees, and ACL and PCL were removed.  A  drill is used to create a starting hole in the distal femur, the canal  was thoroughly irrigated.  A 5-degree left valgus alignment guide is  placed and referencing off the posterior condyles, rotation is marked  and the block pinned to remove 10 mm off the distal femur.  Distal  femoral resection is made with an oscillating saw.  A sizing block is  placed and size 5 is the most appropriate component.  Rotation is marked  at the epicondylar axis.  A size 5 cutting block is placed and the  anterior-posterior and chamfer cuts made.   The tibia is subluxed forward and menisci are removed.  The  extramedullary tibial alignment guide is placed referencing proximally  at the medial aspect of the tibial tubercle and distally along the  second metatarsal axis tibial crest.  Block is pinned to remove 10 mm  off the nondeficient lateral side.  Tibial resection is made with an  oscillating saw.  A size 5 is the most appropriate tibial component and  the proximal tibia is prepared with the modular drill and keel punch for  a size 5.  Femoral preparation is completed with the intercondylar cut.   A size 5 mobile bearing tibial trial and size 5posterior-stabilized  femoral trial and a 10-mm posterior-stabilized rotating platform insert  trial are placed.  With the 10 it hyperextends and there is a little bit  of AP laxity in flexion.  I went to the 12.5, which allowed for full  extension with excellent varus-valgus, anterior and posterior balance  throughout full range of motion.  The patella was everted, thickness  measuring at 26 mm.  Freehand resection was taken to 14 mm, a 41  template is placed, lug holes are drilled, a trial patella is placed and  it tracks normally.  Osteophytes are removed off the posterior femur  with the trial in place.  All trials are removed and the cut bone  surfaces are prepared with pulsatile lavage.   Cement is mixed and once  ready for implantation, the size 5 mobile bearing tibial trial, size 5  posterior-stabilized femoral trial and the 41 patella are cemented in  place, and the patella is held with a clamp.  The trial 12.5 insert is  placed, knee held in full extension, all extruded cement removed.  When  the cement was fully hardened, then the permanent 12.5 mm posterior-  stabilized rotating platform insert is placed in the tibial tray.  The  wound is copiously irrigated with saline solution.  FloSeal is placed on  the posterior capsule, medial and lateral gutters and suprapatellar  area.  A moist sponge is placed and the tourniquet is released for a  total time of 36 minutes.  The sponge is held for 2 minutes and then  removed.  Minimal bleeding is encountered.  The encountered bleeding is  stopped with electrocautery.  The wound is again irrigated and the  extensor mechanism closed with interrupted #1 PDS.  Flexion against  gravity is 135 degrees.  Subcu is closed with interrupted 2-0 Vicryl and  subcuticular running 4-0 Monocryl.  The catheter for the Marcaine pain  pump is placed and the pump is initiated.  Steri-Strips and a bulky  sterile dressing are then applied and he is placed into a knee  immobilizer, awakened and transported to recovery in stable condition.      Ollen Gross, M.D.  Electronically Signed     FA/MEDQ  D:  03/19/2007  T:  03/20/2007  Job:  [id number]

## 2010-09-07 NOTE — Discharge Summary (Signed)
NAME:  Jeremiah Goodwin, Jeremiah Goodwin NO.:  [id number]   MEDICAL RECORD NO.:  [id number]          PATIENT TYPE:  INP   LOCATION:  2024                         FACILITY:  [facility]   PHYSICIAN:  Leighton Roach McDiarmid, M.D.DATE OF BIRTH:  [birth year]   DATE OF ADMISSION:  07/09/2008  DATE OF DISCHARGE:  07/12/2008                               DISCHARGE SUMMARY   PRIMARY CARE Arne Schlender:  [facility].   DISCHARGE DIAGNOSES:  1. Chest pain, noncardiac.  2. Odynophagia.  3. Hyperlipidemia.  4. Gastroesophageal reflux disease.  5. Osteoporosis.  6. Chronic leg pain.  7. Chronic back pain.  8. Insomnia.   DISCHARGE MEDICATIONS:  1. Aspirin 81 mg p.o. daily.  2. Prilosec 40 mg p.o. daily.  3. Zocor 40 mg p.o. nightly.  4. Imdur 30 mg p.o. daily.  5. Elavil 25 mg p.o. daily.  6. Cardizem 30 mg p.o. q.i.d.  7. Neurontin 300 mg p.o. t.i.d.  8. Multivitamin daily.  9. Calcium citrate plus vitamin D b.i.d.  10.Percocet 3/325 mg 1-2 tablets p.o. q.6 h. p.r.n. pain.  11.Lovaza omega-3 fatty acid 1 g t.i.d.  12.Boniva per primary physician.  13.Carafate 1 g/10 mL with each meal and at bedtime.  14.Tizanidine 4 mg p.o. t.i.d.   DISCONTINUE MEDICATIONS:  Calcium carbonate while the patient is taking  PPI.   CONSULTANT:  Cardiology and Gastroenterology.   PROCEDURES:  Stress Myoview July 11, 2008, impression negative for  inducible reversible ischemia.  Ejection fraction 69%.   LABORATORY DATA:  HIV negative.  Cardiac enzymes negative x4.  BMET;  sodium 136, potassium 3.7, BUN 8, creatinine 0.87.  Hemoglobin 13.3,  hematocrit 38.7, platelets 185.  Lipid profile; total cholesterol 193,  triglycerides 64, HDL 61, LDL 119, hemoglobin A1c 6.1%.  UDS positive  cocaine.  Alcohol less than 5.   IMAGING:  Chest x-ray on July 11, 2008, impression low lung volumes  with slight increase in basilar atelectasis.  Chest x-ray on July 09, 2008, impression new left basilar opacity most  compatible  with atelectasis, no edema or pleural effusion.  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   BRIEF HOSPITAL COURSE:  A 55 year old male with history of  hyperlipidemia presented to ED with atypical chest pain.  The patient  with considerable family history for coronary artery disease and  diabetes.  No personal history of diabetes.  Other risk factors include  hyperlipidemia and tobacco history.  1. Chest pain.  The patient was admitted to telemetry for rule out      myocardial ischemia.  Cardiac enzymes were negative x4.  On      hospital day #1, repeat EKG showed  changes with incomplete right      bundle-branch block and left anterior fascicular block, therefore,      Cardiology was consulted.  The patient was started on ACS protocol      with heparin drip, nitroglycerin drip, and Cardizem as the patient      was positive for cocaine and beta-blocker cannot  be used.  The      patient continued to have chest pain on and off described as a      sharp chest pain in the midepigastrium, worse with swallowing.      With the patient's medical history and family history, decision was      made to have the patient undergo stress Myoview as he could not      undergo exercise stress testing secondary to chronic back and leg      pain.  Stress Myoview was negative for any cardiac ischemia.      Attention was then turned to the patient's pain with swallowing.      Chest pain initially was atypical; however, myocardial ischemia      rule out was needed first.  Gastroenterology was consulted for      odynophagia and decision was made for the patient undergo      outpatient workup for odynophagia.  Of note, prior to discharge      chest pain had improved, the patient was continued on Imdur 30 mg      p.o. daily per Cardiology as well as Cardizem p.o. daily.  The      patient will follow up  with Cardiology in 2-4 weeks for      reassessment.  2. Odynophagia.  The patient's chest pain become more concerning with      swallowing, therefore, Gastroenterology was consulted as stated      above.  Of note, the patient was able to swallow during admission      and tolerated food without nausea or vomiting.  However, complaint      of pain with swallowing both solid and liquid at times.  Per      Gastroenterology, the patient will follow up as outpatient in 2-4      weeks, will have barium study prior to initial visit to evaluate      swallowing function.  3. Hyperlipidemia.  Lipid panel was checked as the patient was risk      stratified with both hemoglobin A1c and lipid panel.  The patient      was started on Zocor with ACS protocol.  We will continue as      outpatient as well as Lovaza omega-3 fatty acids.  Of note, since      the patient taking calcium and vitamin D with history of      osteoporosis, calcium was held as hospital formulary, did not have      calcium citrate instead a calcium carbonate which should not be use      with PPIs.  4. Gastroesophageal reflux disease.  The patient with history of      gastroesophageal reflux disease, was continued on PPI.  During in      house, calcium held secondary to calcium carbonate formulary.  5. Osteoporosis.  The patient will continue on Boniva as outpatient.  6. Chronic leg and back pain.  The patient was continued on home pain      medications.   DISCHARGE INSTRUCTIONS:  The patient is returned for increased chest  pain or shortness of breath.  The patient will follow up to have barium  swallow testing, we will call with appointment.   ISSUES FOR FOLLOWUP:  Barium swallow to evaluate odynophagia.   FOLLOWUP APPOINTMENTS:  The patient is to call Dr. Barrett Shell  Gastroenterology in 2-4 weeks for appointment.  The patient is to follow  with Dr. Sharyn Lull,  Cardiology in 2-4 weeks.  The patient will follow with  [facility]  as scheduled.   DISCHARGE CONDITION:  Stable/improved.   DISCHARGE LOCATION:  Home.      Milinda Antis, MD  Electronically Signed      Leighton Roach McDiarmid, M.D.  Electronically Signed    KD/MEDQ  D:  07/13/2008  T:  07/14/2008  Job:  [id number]   cc:   Everardo All. Madilyn Fireman, M.D.  Eduardo Osier. Sharyn Lull, M.D.  Marcene Duos, M.D.

## 2010-09-10 NOTE — Op Note (Signed)
NAME:  Jeremiah Goodwin, Jeremiah Goodwin NO.:  [id number]   MEDICAL RECORD NO.:  [id number]                   PATIENT TYPE:  INP   LOCATION:  3015                                 FACILITY:  [facility]   PHYSICIAN:  Danae Orleans. Venetia Maxon, M.D.               DATE OF BIRTH:  [birth year]   DATE OF PROCEDURE:  07/18/2002  DATE OF DISCHARGE:                                 OPERATIVE REPORT   NEUROSURGICAL OPERATIVE NOTE   PREOPERATIVE DIAGNOSIS:  Herniated lumbar disk with spondylosis,  degenerative disk disease, and nonunion of prior lumbar fusion.   POSTOPERATIVE DIAGNOSIS:  Herniated lumbar disk with spondylosis,  degenerative disk disease, and nonunion of prior lumbar fusion.   PROCEDURE:  Diskectomy, L4-5; with bilateral decompression, L4-5; with  transverse lumbar interbody fusion at L4-5 with 11-mm Synthes domed  allograft and morcellized autograft; with pedicle fixation L4 through S1  bilaterally; with posterolateral arthrodesis L3 through S1 bilaterally.   SURGEON:  Danae Orleans. Venetia Maxon, M.D.   ASSISTANT:  Coletta Memos, M.D.   ANESTHESIA:  General endotracheal anesthesia.   ESTIMATED BLOOD LOSS:  1000 mL, 400 mL of Cell Saver blood returned to the  patient.   COMPLICATIONS:  None.   DISPOSITION:  Recovery.   INDICATIONS:  The patient is a 55 year old disabled man with herniated  lumbar disk at L4-5 level with right greater than left lower extremity pain.  He has degenerative disk disease at L3-4 and prior fusion at L5-S1 with  interbody cages, from which he had a considerable amount of persistent  lumbar pain.  It was elected to take him to surgery for decompression and  fusion and supplemental fixation at the previously-fused lumbar level.   DESCRIPTION OF PROCEDURE:  The patient was brought to the operating room.  Following the satisfactory and uncomplicated induction of general  endotracheal anesthesia and placement of intravenous lines, the patient was  placed in  a prone position on the operating table.  His low back was shaved,  prepped, and draped in the usual sterile fashion.  The previous scar was  excised.  The incision was carried through copious adipose tissue to the  lumbodorsal fascia, which was incised bilaterally.  Subperiosteal dissection  was performed, exposing the L3 through L5 transverse processes with the  sacral alae bilaterally.  The self-retraining Versatrac retractor was placed  to facilitate exposure.  Total laminectomy of L4 was performed as there had  been a partial laminectomy before at this level, so this is a redo  laminectomy.  The L4 nerve roots were decompressed bilaterally, as were the  L5 nerve roots.  There was a significant central and right-sided disk  herniation at the L4-5 level.  The disk space was then incised and disk  material was removed in a piecemeal fashion.  Subsequently the disk space  spreader ___________ and accept the instrumentation, and an 11-mm T-LIF bone  dowel was inserted and counter sunk appropriately at this level.  Morcellized bone autograft was placed overlying this and tamped into  position.  Subsequently pedicle screw fixation using SD90 pedicle screws was  placed at the L4 through sacral levels bilaterally.  Positioning of the  pedicle screws was confirmed on AP and lateral fluoroscopy.  Screws 45 x  6.75 mm were placed at L4, similarly-sized screws at L5, and 40 x 6.75 mm  screws at the sacral level.  Morcellized bone autograft was admixed with 30  mL of Vitoss, which was reconstituted with blood aspirated at the time of  the decompression along with drillings from the bone.  This was inserted  along the transverse processes of L3 through the sacral levels bilaterally.  Subsequently a crosslink was placed after 60 mm rods were placed over the  pedicle screws.  All screws were locked in situ, and a small crosslink was  applied.  The wound was then copiously irrigated with saline prior  to  placing the morcellized autograft, and the lumbodorsal fascia was  reapproximated with 1 Vicryl suture, the subcutaneous tissue was  reapproximated with 2-0 Vicryl interrupted inverted sutures, and the skin  edges were reapproximated with interrupted 3-0 Vicryl subcuticular suture.  The wound was dressed with Benzoin and Steri-Strips, Telfa gauze, and tape.  The patient was extubated in the operating room and taken to the recovery  room in stable and satisfactory condition, having tolerated his operation  well.  Counts were correct at the end of the case.                                               Danae Orleans. Venetia Maxon, M.D.    JDS/MEDQ  D:  07/18/2002  T:  07/20/2002  Job:  [id number]

## 2010-09-10 NOTE — Op Note (Signed)
NAME:  Jeremiah Goodwin, Jeremiah Goodwin NO.:  [id number]   MEDICAL RECORD NO.:  [id number]                   PATIENT TYPE:  OIB   LOCATION:  2899                                 FACILITY:  [facility]   PHYSICIAN:  Danae Orleans. Venetia Maxon, M.D.               DATE OF BIRTH:  1956/02/17   DATE OF PROCEDURE:  05/02/2003  DATE OF DISCHARGE:  05/02/2003                                 OPERATIVE REPORT   PREOPERATIVE DIAGNOSIS:  Chronic intractable back and bilateral lower  extremity pain.   POSTOPERATIVE DIAGNOSIS:  Chronic intractable back and bilateral lower  extremity pain.   PROCEDURE:  Placement of percutaneous epidural spinal cord stimulator for a  trial of spinal cord stimulation.   SURGEON:  Danae Orleans. Venetia Maxon, M.D.   ANESTHESIA:  MAC with local lidocaine.   COMPLICATIONS:  None.   DISPOSITION:  Recovery room.   INDICATIONS FOR PROCEDURE:  The patient is a 55 year old man with chronic  low back and bilateral lower extremity pain, left greater than right.  It  was elected to take him to surgery for placement of an epidural spinal cord  stimulator electrode for a trial of spinal cord stimulation.   PROCEDURE:  The patient was brought to the operating room.  He was given  intravenous sedation and was then placed in the prone position on the  operating table. His low back and mid back were prepped and draped in the  usual sterile fashion.  Under fluoroscopic visualization and after  infiltrating the deep tissues and subcutaneous tissues with local lidocaine  a 14 gauge Tuohy needle was inserted in the epidural space entering the  spinal canal at the level of T12-L1 using loss of resistance technique.  The  electrode was threaded in the epidural space to the level of T8-9.  After  initial stimulation it was felt that the lower two electrode contacts were  better than the upper two and the electrode was brought back so that the top  electrode was at the upper aspect of T9.  Then  using a trial of stimulation  the patient had good stimulation to his left leg.  It was elected to leave  this in that position and an anchor was placed after the needle was  withdrawn.  This was sutured into position with 2-0 silk stitches.  Final x-  ray demonstrated the electrode had not migrated.  A sterile occlusive  dressing was placed.  The patient tolerated the procedure well.                                               Danae Orleans. Venetia Maxon, M.D.    JDS/MEDQ  D:  05/02/2003  T:  05/03/2003  Job:  [id number]

## 2010-09-10 NOTE — Discharge Summary (Signed)
NAME:  GRYPHON, VANDERVEEN NO.:  [id number]   MEDICAL RECORD NO.:  [id number]                   PATIENT TYPE:  INP   LOCATION:  3015                                 FACILITY:  [facility]   PHYSICIAN:  Danae Orleans. Venetia Maxon, M.D.               DATE OF BIRTH:  [birth year]   DATE OF ADMISSION:  07/14/2002  DATE OF DISCHARGE:  07/23/2002                                 DISCHARGE SUMMARY   REASON FOR ADMISSION:  1. Lumbar disk displacement.  2. Lumbar disk degeneration.  3. Lumbosacral spondylosis.   ADDITIONAL DIAGNOSES:  1. Pulmonary collapse.  2. Chronic airway obstructive disease.  3. Constipation.  4. Tobacco use disorder.  5. Malfunction of orthopedic device and abnormal reaction to an artificial     implant.   FINAL DIAGNOSES:  1. Lumbar disk displacement.  2. Lumbar disk degeneration.  3. Lumbosacral spondylosis.  4. Pulmonary collapse.  5. Chronic airway obstructive disease.  6. Constipation.  7. Tobacco use disorder.  8. Malfunction of orthopedic device and abnormal reaction to an artificial     implant.   HISTORY OF PRESENT ILLNESS:  The patient is a 55 year old man who is status  post lumbar fusion of L5-S1 with Ray cages approximately five years ago by  Clydene Fake, M.D., who has undergone multiple spinal surgeries by Dr.  Phoebe Perch and Izell Owen. Elesa Hacker, M.D.  He presented to the emergency room at  [facility]. [facility] with acute worsening of low back and right  lower extremity pain in the last three weeks.  He was unable to stand or  walk any distance.  He went to the [facility] Emergency Room on  several occasions.  He was taken Percocet for pain and a prednisone taper,  which did not help him at all.  He presented to the [facility]  today and was sent to [facility]. [facility] for further care.  The  patient had an MRI scan of the lumbar spine on July 04, 2002, which showed  edema/artifact at L5-S1  with marked stenosis at L4-5 and to a lesser degree  at the L3-4 level with left foraminal stenosis.  At the L4-5 level, there  was marked lateral recess stenosis and foraminal stenosis in addition to a  small focal disk herniation at this level.  The patient graded his low back  pain at 6-7/10 and now 9/10.  He has neck pain and also right triceps  weakness with prior anterior and posterior cervical surgery.  He smokes one  packs of cigarettes a day.  He is disabled.   ADMISSION MEDICATIONS:  1. Neurontin.  2. Baclofen.  3. Bextra.  4. Elavil.  5. Stool softeners.  6. Percocet.  7. Xanax.   PHYSICAL EXAMINATION:  His initial examination demonstrated weakness in his  right arm at 0-1 in the triceps on  the right.  Wrist flexors are weak at 4-  /5 on the right.  The lower extremities were full with the exception of 4-/5  right hip abductors and 4/5 right extensor hallucis longus.  He has L5-S1  distribution numbness in his right leg and S1 on the left.  He had a  positive straight leg raise.   HOSPITAL COURSE:  He was admitted for pain control.  He was started on PCA  morphine.  It was recommended that he undergo exploration of his old fusion  with fusion at the L4-5 level.  He was taken to surgery on July 18, 2002.  At that point, he underwent redo L4-5 bilateral decompression and fusion at  L4-5 with pedicle screw fixation at L4-S1 and posterolateral arthrodesis at  the L3-S1 levels.  Postoperatively, he was immobilized in the brace.  He had  some abdominal distention and constipation.  He had a fever up to 102.5  degrees on July 20, 2002.  On July 21, 2002, his fever was better.  On  July 22, 2002, he was back up to a fever of 102.4 degrees and he had a  pneumonia on chest x-ray.  He was seen by critical care medicine and he was  felt to have pulmonary atelectasis rather then a true pneumonia.  His  temperature was 101.3 degrees on July 23, 2002.  He was overall doing much   better.  His PCA was discontinued as was his IV.  He was started on oral  Percocet.   DISPOSITION:  He was discharged home at that point with instructions to take  it easy at home and notify us if he has any recurrence of fever.   DISCHARGE MEDICATIONS:  1. Percocet for pain.  2. Valium for muscle spasms.  3. Ciprofloxacin for 10 days.  4. Nicotine patch.   ACTIVITY:  He was instructed not to engage in any lifting, bending,  twisting, or driving.   WOUND CARE:  Keep his wound clean and dry.  Okay to shower.   FOLLOW-UP:  Follow up with Danae Orleans. Venetia Maxon, M.D., in three weeks with AP and  lateral lumbar spine x-rays at that point.                                               Danae Orleans. Venetia Maxon, M.D.    JDS/MEDQ  D:  08/25/2002  T:  08/25/2002  Job:  [id number]

## 2010-09-10 NOTE — Op Note (Signed)
NAME:  DRAYCE, TAWIL NO.:  [id number]   MEDICAL RECORD NO.:  [id number]          PATIENT TYPE:  AMB   LOCATION:  NESC                         FACILITY:  [facility]   PHYSICIAN:  Ollen Gross, M.D.    DATE OF BIRTH:  1956-03-16   DATE OF PROCEDURE:  08/30/2006  DATE OF DISCHARGE:                               OPERATIVE REPORT   PREOPERATIVE DIAGNOSIS:  Left knee meniscal tear, osteoarthritis.   POSTOPERATIVE DIAGNOSIS:  Left knee meniscal tear, osteoarthritis.   PROCEDURE:  Left knee arthroscopy with meniscal debridement.   SURGEON:  Ollen Gross, M.D.   ASSISTANT:  None.   ANESTHESIA:  General.   BLOOD LOSS:  Minimal.   DRAINS:  None.   COMPLICATIONS:  None.  Stable to recovery.   CLINICAL NOTE:  Jeremiah Goodwin is a 55 year old male who had a left knee  arthroscopy done in September 2007 with meniscal debridement.  At that  time it was noted that he also had arthritic change in the medial  compartment.  He had extensive nonoperative management including  injections and physical therapy.  He had slight improvement and then had  an acute worsening.  It was felt as though he may have retorn the  meniscus.  MRI confirmed that the meniscus was retorn.  He presents now  for arthroscopy and debridement.   PROCEDURE IN DETAIL:  After successful administration of general  anesthetic, tourniquet was placed high on the left thigh.  The left  lower extremity was prepped and draped in the usual sterile fashion.  Standard superomedial and inferolateral incisions were made.  Inflow  cannula passed superomedial.  Camera passed inferolateral.  Arthroscopic  visualization proceeds.  Undersurface of patella shows some low grade  chondromalacia as does the trochlea.  The medial aspect of the trochlea  is exposed bone.  The medial and lateral gutters are visualized.  There  is some synovitis, but no loose bodies.  Flexion valgus force is applied  to the knee and the medial  compartment is entered.  He has a large  amount of exposed bone on the medial tibial plateau and femoral condyle.  The meniscal debridement from before looked stable with the exception of  one area where he has retorn at the posterior horn.  Spinal needles are  used to localize the inferomedial portal.  A small incision made,  dilator placed and then the torn area is debrided back to a stable base  with baskets, 4.2 mm shaver and sealed off with the ArthroCare device.  The rest of the medial compartment is examined and there is exposed bone  throughout.  The intercondylar notch is visualized.  The ACL looks  normal.  Lateral compartment is entered and it looks normal.  I again  addressed the patellofemoral compartment and there is no unstable  cartilage.  We debrided the hypertrophic synovium in the suprapatellar  area and in the gutters.  The arthroscopic equipment is then removed  from inferior portals which were  closed with interrupted 4-0 nylon.  20 cc of 0.25% Marcaine with epi are  injected  via the inflow cannula.  Then that is removed and that portal  closed with nylon.  Bulky sterile dressing is applied.  He is awakened  and transferred to recovery in stable condition.      Ollen Gross, M.D.  Electronically Signed     FA/MEDQ  D:  08/30/2006  T:  08/30/2006  Job:  [id number]

## 2010-09-10 NOTE — Discharge Summary (Signed)
NAME:  Jeremiah Goodwin, Jeremiah Goodwin NO.:  [id number]   MEDICAL RECORD NO.:  [id number]          PATIENT TYPE:  INP   LOCATION:  1610                         FACILITY:  [facility]   PHYSICIAN:  Ollen Gross, M.D.    DATE OF BIRTH:  1955/06/04   DATE OF ADMISSION:  03/19/2007  DATE OF DISCHARGE:  03/25/2007                               DISCHARGE SUMMARY   ADMITTING DIAGNOSES:  1. Osteoarthritis left knee.  2. Elevated cholesterol.   DISCHARGE DIAGNOSIS:  1. Osteoarthritis left knee status post left total knee arthroplasty.  2. Bibasilar atelectasis postop.  3. Mild postop blood loss anemia did not require transfusion.  4. Elevated cholesterol.   PROCEDURE:  03/19/2007 left total knee.  Surgeon Dr. Lequita Halt.  Assistant  Alexzandrew Greggory Brandy.   CONSULTS:  None.   BRIEF HISTORY:  The patient is a 55 year old male with end-stage  arthritis medial compartment left knee, previous arthroscopies, but has  had progressive cartilage dysfunction now presents for total knee  arthroplasty.   LABORATORY DATA:  Preop CBC showed hemoglobin of 15.8, hematocrit 45.1,  white cell count 4.9 drifted down, hemoglobin down to 12.2 then 10.4  came back up to 11.1, last H&H 9.9 and 20.0.  PT/PTT preop 12.5 and 30  respectively.  INR 0.9.  Serial protimes follow PT/INR 29.6, 2.7.  Chem  panel on admission all within normal limits.  Serial B mets were  followed.  Electrolytes remained within normal limits, CO2 did go up to  26 to 33 back down to 31.  Preop UA showed trace leukocyte esterase,  0  to 2 white, 0 to 2 red cells otherwise negative.  Follow-up UA negative.  Blood group type O+.  Urine culture 11/27 no growth from the second UA.  EKG March 12, 2007 normal sinus rhythm, left axis deviation, no  significant change since last tracing of 07/14/2002 confirmed by Dr. Dani Gobble.  Chest x-ray 03/12/2007 no active lung disease, stable mild  cardiomegaly.  Portable chest 11/26 low lung  volumes and bibasilar  atelectasis, mild cardiomegaly and pulmonary vascular congestion.  Follow-up chest on 03/22/2007, improved aeration but persistent low  volume some bibasilar plate-like atelectasis.  Follow-up AP chest maybe  helpful.  Follow-up chest 11/30 no acute cardiopulmonary disease.  No  active infiltrate or pleural effusion.   HOSPITAL COURSE:  The patient was admitted to [facility]  tolerated seizure well, later transferred to the recovery room on the  orthopedic floor started on PCA and p.o. his pain control following  surgery.  Started up out of bed on day one encouraged p.m. meds. Due to  his home situation, he wanted to look into skilled nursing rehab center.  His mother who he lived with was unable to care for her due to her  health.  We got social services involved.  Encouraged mobility.  By day  two, is doing a little bit better.  Weaned over to p.m. meds.  Dressing  change, incision looked good.  Started getting up walking and actually  was ambulating about a 110 feet by  day two, progressing well.  He  unfortunately ran temps later the next day requiring workup utilizing  urine which proved negative and chest x-ray which did show some  bibasilar atelectasis.  Encouraged incentive spirometer, antipyretics.  He continued progress well from a physical therapy standpoint,  ambulating greater than 200 feet.  He still ran intermittent temps over  the weekend, did a follow-up chest x-ray which showed some improved  aeration but still persistent atelectasis.  Continued with monitoring.  No infiltrates were appreciated.  By postop day four, he had a little  confusion, disorientation over the weekend which was felt to be due to  his temperatures.  He was more alert and awake and back to his baseline  on postop day  four.  Albuterol nebs were added to help out with the  atelectasis.  Repeat chest x-ray was ordered before he was discharged,  continued to progress  well. On 11/30 the repeat chest x-ray was done  showed no acute cardiopulmonary issues, progressing well.  He had done  well with this therapy and felt that he was more independent and ready  to go home.   DISCHARGE PLAN:  1. Discharged home March 25, 2007.  2. Discharge diagnoses please see above.  3. Discharge meds ,Percocet, OxyContin, Robaxin, Coumadin.   ACTIVITY:  Weightbearing as tolerated total knee protocol, PT home  nursing.   DISPOSITION:  Home.  Follow-up 2 weeks.   CONDITION ON DISCHARGE:  Improving.      Alexzandrew L. Perkins, P.A.C.      Ollen Gross, M.D.  Electronically Signed    ALP/MEDQ  D:  04/24/2007  T:  04/24/2007  Job:  [id number]   cc:   Ollen Gross, M.D.  Fax: [phone]

## 2010-09-10 NOTE — H&P (Signed)
NAME:  Jeremiah Goodwin, SCHRADER NO.:  [id number]   MEDICAL RECORD NO.:  [id number]                   PATIENT TYPE:  INP   LOCATION:  3015                                 FACILITY:  [facility]   PHYSICIAN:  Danae Orleans. Jeremiah Goodwin, M.D.               DATE OF BIRTH:  [birth year]   DATE OF ADMISSION:  07/14/2002  DATE OF DISCHARGE:                                HISTORY & PHYSICAL   REASON FOR ADMISSION:  Severe low back pain and right lower extremity pain,  unable to walk.   HISTORY OF PRESENT ILLNESS:  The patient is a 55 year old man, who is status  post lumbar fusion L5-S1 with Ray cages approximately five years ago by Dr.  Phoebe Perch with prior multiple cervical spinal surgeries by Dr. Phoebe Perch and  Elesa Hacker, who now presents with acute worsening of low back pain and right  lower extremity pain over the last three weeks.  He is unable to stand or  walk any distance.  He has been to [facility] on  several occasions.  He has taken Percocet for pain, prednisone taper which  he said did not help him at all.  He returned to [facility] today,  and he was sent to [facility] for further care per my instructions.   The patient had an MRI scan of his lumbar spine on 07/04/2002 which shows  edema/artifact at L5-S1 with marked stenosis of L4-5 and to a lesser degree  at the L3-4 level with left foraminal stenosis.  At the L4-5 level, there is  marked lateral recess stenosis and foraminal stenosis in addition to a small  focal disk herniation at this level.  The patient states his baseline low  back pain is 6 to 7/10 and now 9/10.  He also has neck pain and right  triceps weakness with prior anterior and posterior cervical surgery.  He is  disabled.  He smokes one pack of cigarettes per day.   MEDICATIONS:  1. Neurontin 300 mg 3 times daily.  2. Baclofen 10 twice daily.  3. Bextra 10 mg daily.  4. Elavil 150 mg q.h.s.  5. Stool softeners.  6.  Percocet 1 to 2 every 6 hours as needed for pain.  7. Xanax 1 mg twice daily.   ALLERGIES:  VICODIN causes itching.   PHYSICAL EXAMINATION:  VITAL SIGNS:  Heart rate 88, temperature 98.3,  respiratory rate 20, blood pressure 131/74.  GENERAL:  He is awake and alert and fully oriented.  He is moderately obese.  He is lying on a stretcher.  He winces with pain or on movement.  NEUROLOGIC:  Pupils are equal, round, and reactive to light.  Extraocular  movements are intact.  Facial motor and sensation are intact and symmetric.  Hearing is intact to pin drop.  Palate is upgoing.  Shoulder shrug is  symmetric.  He has a healed anterior and posterior cervical incision.  He  has atrophy of right triceps at 0 to 1/5, otherwise full strength in the  upper extremities with the exception of wrist flexors which are weak at 4-/5  on the right.  Lower extremity strength is full in all motor groups  throughout with exception of 4-/5 right hip abductors and 4/5 right extensor  hallicis longus strength.  He notes decreased pin sensation on his right  lower extremity in L5 distribution and S1 distribution on the left.  Straight leg raise is positive at 40 degrees on the right and negative on  the left.  Reflexes are 1 in the biceps, absent in the right triceps, 1 in  the left triceps. 2 at the right knee, 1 at the left knee, 2 at the ankles.  Great toes downgoing to plantar stimulation.  CHEST:  Clear to auscultation.  HEART:  Regular rate and rhythm without murmur.  ABDOMEN:  Soft, obese, active bowel sounds.  No hepatosplenomegaly  appreciated.  EXTREMITIES:  Without edema, clubbing, or cyanosis.   IMPRESSION:  The patient is a 55 year old disabled man with prior fusion at  L5-S1 with severe degenerative disk disease, lumbar radiculopathy on the  right L5 level with L4-5 disk herniation, degenerative disk disease, and  spinal stenosis, possibly left greater than right foraminal stenosis at the  L3-4  level.  His baseline is 6 to 7/10 in terms of his lumbar pain.  He is  being admitted for pain control and will likely require a decompressive  procedure.  Will obtain cervical and lumbar spine x-ray series to assess his  prior cage fusion as well as prior anterior cervical surgery history.                                               Danae Orleans. Jeremiah Goodwin, M.D.    JDS/MEDQ  D:  07/14/2002  T:  07/15/2002  Job:  [id number]

## 2010-09-10 NOTE — Op Note (Signed)
NAME:  Jeremiah Goodwin, Jeremiah Goodwin NO.:  [id number]   MEDICAL RECORD NO.:  [id number]          PATIENT TYPE:  AMB   LOCATION:  NESC                         FACILITY:  [facility]   PHYSICIAN:  Ollen Gross, M.D.    DATE OF BIRTH:  03/14/1956   DATE OF PROCEDURE:  01/12/2006  DATE OF DISCHARGE:                                 OPERATIVE REPORT   PREOPERATIVE DIAGNOSIS:  Left knee medial meniscal tear.   POSTOPERATIVE DIAGNOSIS:  Left knee medial meniscal tear.   PROCEDURE:  Left knee arthroscopy with meniscal debridement.   SURGEON:  Ollen Gross, MD.   ASSISTANT:  None.   ANESTHESIA:  General.   ESTIMATED BLOOD LOSS:  Minimal.   DRAINS:  None.   COMPLICATIONS:  None.   CONDITION:  Stable to recovery room.   BRIEF CLINICAL NOTE:  Jeremiah Goodwin is a 55 year old male, several month history  severe left knee pain, mechanical symptoms, and recurrent effusions.  He has  a medial meniscal tear and some degenerative change.  He has significant  mechanical symptoms.  He presents now for arthroscopy and debridement.   PROCEDURE IN DETAIL:  After successful administration of general anesthetic,  a tourniquet was placed high on the left thigh, and the left lower extremity  was prepped and draped in the usual sterile fashion.  Standard superomedial  and inferolateral incisions were made, and inflow cannula was passed through  the superomedial incision and the camera through the inferolateral.  Once  confirmed to be intraarticular, the inflow was initiated.  The undersurface  of the patella shows some grade 2 chondromalacia, and some area where the  cartilage looks unstable.  The trochlea has a 1 x 2-cm defect on the medial  aspect of the trochlea, otherwise it looks pretty normal.  The medial and  lateral gutters were visualized, and there were no loose bodies.  Flexion  and valgus was applied to the knee, and the medial compartment entered.   The medial compartment demonstrates a  severe tear, body and posterior horn  of the medial meniscus.  There is also exposed bone on the medial tibial  plateau, an area of about 1 x 1 cm, as well as on the medial femoral  condyle.  The rest of the condyle and plateau look fairly normal except some  thinning of the cartilage.  A spinal needle was used to localize the  inferomedial portal, a small incision made, dilator placed, and a  probe  placed because the meniscal tear was unstable.  It was debrided back to a  stable base with baskets and a 4.2-mm shaver.  There was no evidence of any  unstable cartilage lesion on the medial side.  The intercondylar notch was  visualized, and the ACL looks normal.  The lateral compartment was entered,  and it looks normal.  I addressed the patellofemoral compartment.  The  unstable cartilage on the undersurface of the patella was debrided back to a  stable cartilaginous base with a shaver.  There were some small areas of  synovitis, which were debrided with the ArthroCare.  Note that the meniscal  remnant was sealed off with the ArthroCare medially once we completed the  debridement.  Arthroscopic equipment subsequently removed from the inferior  portals, which were closed with interrupted #4-0 nylon.  20 mL of 0.25%  Marcaine with EPI injected through the inflow cannula, the cannula removed,  and then that portal closed with nylon.  A bulky, sterile dressing was  applied, and he was awakened and transported to Recovery in stable  condition.      Ollen Gross, M.D.  Electronically Signed     FA/MEDQ  D:  01/12/2006  T:  01/13/2006  Job:  [id number]

## 2010-10-10 ENCOUNTER — Emergency Department (HOSPITAL_BASED_OUTPATIENT_CLINIC_OR_DEPARTMENT_OTHER)
Admission: EM | Admit: 2010-10-10 | Discharge: 2010-10-10 | Disposition: A | Discharge status: Home / Self Care | Payer: Medicare Other | Attending: Emergency Medicine | Admitting: Emergency Medicine

## 2010-10-10 ENCOUNTER — Emergency Department (INDEPENDENT_AMBULATORY_CARE_PROVIDER_SITE_OTHER): Payer: Medicare Other

## 2010-10-10 DIAGNOSIS — Y92009 Unspecified place in unspecified non-institutional (private) residence as the place of occurrence of the external cause: Secondary | ICD-10-CM | POA: Insufficient documentation

## 2010-10-10 DIAGNOSIS — M7989 Other specified soft tissue disorders: Secondary | ICD-10-CM

## 2010-10-10 DIAGNOSIS — S6980XA Other specified injuries of unspecified wrist, hand and finger(s), initial encounter: Secondary | ICD-10-CM

## 2010-10-10 DIAGNOSIS — W298XXA Contact with other powered powered hand tools and household machinery, initial encounter: Secondary | ICD-10-CM | POA: Insufficient documentation

## 2010-10-10 DIAGNOSIS — W3189XA Contact with other specified machinery, initial encounter: Secondary | ICD-10-CM

## 2010-10-10 DIAGNOSIS — S61209A Unspecified open wound of unspecified finger without damage to nail, initial encounter: Secondary | ICD-10-CM | POA: Insufficient documentation

## 2011-02-01 LAB — CBC
HCT: 28 — ABNORMAL LOW
HCT: 29.8 — ABNORMAL LOW
HCT: 35 — ABNORMAL LOW
HCT: 45.1
Hemoglobin: 11.1 — ABNORMAL LOW
Hemoglobin: 12.2 — ABNORMAL LOW
Hemoglobin: 15.8
Hemoglobin: 9.9 — ABNORMAL LOW
MCHC: 34.8
MCHC: 34.9
MCHC: 34.9
MCV: 91.8
MCV: 92.4
Platelets: 177
Platelets: 203
RBC: 3.82 — ABNORMAL LOW
RBC: 4.93
RDW: 12.9
RDW: 12.9
RDW: 12.9
WBC: 6.7
WBC: 7.5
WBC: 9.6

## 2011-02-01 LAB — COMPREHENSIVE METABOLIC PANEL
ALT: 33
Alkaline Phosphatase: 89
BUN: 10
CO2: 26
Calcium: 10
GFR calc non Af Amer: >60
Glucose, Bld: 107 — ABNORMAL HIGH
Potassium: 3.9
Sodium: 137
Total Protein: 7.6

## 2011-02-01 LAB — PROTIME-INR
INR: 0.9
INR: 1.8 — ABNORMAL HIGH
INR: 2.3 — ABNORMAL HIGH
INR: 2.7 — ABNORMAL HIGH
Prothrombin Time: 12.5
Prothrombin Time: 18.5 — ABNORMAL HIGH
Prothrombin Time: 21.5 — ABNORMAL HIGH
Prothrombin Time: 26 — ABNORMAL HIGH

## 2011-02-01 LAB — URINALYSIS, ROUTINE W REFLEX MICROSCOPIC
Bilirubin Urine: NEGATIVE
Glucose, UA: NEGATIVE
Hgb urine dipstick: NEGATIVE
Ketones, ur: NEGATIVE
Nitrite: NEGATIVE
Protein, ur: NEGATIVE
Protein, ur: NEGATIVE
Urobilinogen, UA: 0.2
pH: 7

## 2011-02-01 LAB — BASIC METABOLIC PANEL
BUN: 4 — ABNORMAL LOW
CO2: 31
CO2: 33 — ABNORMAL HIGH
Chloride: 101
Chloride: 101
Creatinine, Ser: 0.98
GFR calc Af Amer: >60
Glucose, Bld: 117 — ABNORMAL HIGH
Glucose, Bld: 141 — ABNORMAL HIGH
Potassium: 3.7
Sodium: 136

## 2011-02-01 LAB — DIFFERENTIAL
Basophils Absolute: 0
Lymphocytes Relative: 12
Lymphs Abs: 1.2
Neutro Abs: 7.2

## 2011-02-01 LAB — URINE CULTURE: Special Requests: POSITIVE

## 2011-02-01 LAB — TYPE AND SCREEN: Antibody Screen: NEGATIVE

## 2011-05-30 ENCOUNTER — Ambulatory Visit (INDEPENDENT_AMBULATORY_CARE_PROVIDER_SITE_OTHER): Payer: Medicare Other | Admitting: Family Medicine

## 2011-05-30 DIAGNOSIS — F419 Anxiety disorder, unspecified: Secondary | ICD-10-CM

## 2011-05-30 DIAGNOSIS — M519 Unspecified thoracic, thoracolumbar and lumbosacral intervertebral disc disorder: Secondary | ICD-10-CM | POA: Insufficient documentation

## 2011-05-30 DIAGNOSIS — M5137 Other intervertebral disc degeneration, lumbosacral region: Secondary | ICD-10-CM

## 2011-05-30 DIAGNOSIS — M51379 Other intervertebral disc degeneration, lumbosacral region without mention of lumbar back pain or lower extremity pain: Secondary | ICD-10-CM

## 2011-05-30 DIAGNOSIS — M5090 Cervical disc disorder, unspecified, unspecified cervical region: Secondary | ICD-10-CM

## 2011-05-30 DIAGNOSIS — G894 Chronic pain syndrome: Secondary | ICD-10-CM | POA: Insufficient documentation

## 2011-05-30 DIAGNOSIS — I10 Essential (primary) hypertension: Secondary | ICD-10-CM

## 2011-05-30 DIAGNOSIS — E785 Hyperlipidemia, unspecified: Secondary | ICD-10-CM

## 2011-05-30 DIAGNOSIS — G8929 Other chronic pain: Secondary | ICD-10-CM

## 2011-05-30 MED ORDER — OXYCODONE-ACETAMINOPHEN 5-325 MG PO TABS: 1.0000 | ORAL_TABLET | Freq: Four times a day (QID) | ORAL | Status: AC | PRN

## 2011-05-30 MED ORDER — GABAPENTIN 300 MG PO CAPS: 300.0000 mg | ORAL_CAPSULE | Freq: Three times a day (TID) | ORAL | Status: DC

## 2011-05-30 MED ORDER — TRAMADOL HCL 50 MG PO TABS: 50.0000 mg | ORAL_TABLET | Freq: Three times a day (TID) | ORAL | Status: AC | PRN

## 2011-05-30 NOTE — Patient Instructions (Signed)
Increase Neurontin to twice daily for 3 days then on up to 3 times daily.  Used tramadol during waking hours. He is to understand that the Percocet is primarily for nighttime use to align to get some rest.  He can continue on his other medications at this time.

## 2011-05-30 NOTE — Progress Notes (Signed)
Subjective:    Patient ID: Jeremiah Goodwin, male    DOB: 07-Apr-1956, 56 y.o.   MRN: [id number]  HPI Jeremiah Goodwin is here today for the first time. He recently moved here from [place] and is living with his ex-wife who has back problems also. He has a history of disc disease for the past decade. 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 Then he ruptured a disc in his back. One thing led to the next, and he had surgeries on his back twice and later had to have it 3 times on his neck also for disc disease. The last time he saw the neurosurgeon, Dr. Venetia Maxon, repeat back surgery was being considered. The patient is very reluctant to return to him. He was referred at one point to a pain clinic, but was not able to get in there. He has been on pain medications off and on for a long time. The last time he took Percocet was approximately 3 years ago. He had a primary care doctor in [place], but since he is in [place] now and transportation is a problem he has chosen to come here. Someone recommended that he see Dr. Merla Riches, and he was here to see him today but Dr. Merla Riches was too far behind.  He has atrophy of the muscle on the right upper arm. This is consequent to the cervical disc disease. He has numbness of his legs from proximal approximately the knee down on both sides. The left leg is worse than the right. Recently when they refilled his Neurontin they switched him from 3 times a day to just once a day. He used to take ibuprofen but it was bothering his stomach and he had to quit that. The pain is bad enough that it brings him to 2 years, often at nighttime he cries with the pain.   Review of Systems He has problems with anxiety no chest pains or breathing problems. Stomach does okay off of the ibuprofen.    Objective:   Physical Exam Overweight Afro-American male in obvious discomfort. When sitting talking to me he had too much pain to be able to continue sitting there,  and had to get up to move around a little bit alleviate the pain. He is alert and oriented. Neck has decreased range of motion. Atrophic muscles in the upper arm. He has legs appear normal but the straight leg pot was positive on the left. The right and left legs had loss of sensation to filament test from the knee down. The left reaches up little higher than the right. Straight leg raising test was negative on the right       Assessment & Plan:  New patient with: Lumbar disc disease with radiculopathy.  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  Hypertension.  Hyperlipidemia.  Will progressively increase his Neurontin doses. We'll treat daytime pain with tramadol. I realize it doesn't give him the best relief, but choose to try to avoid excessive opioid medications. We'll give a limited number of Percocet for use at nighttime until we see how he does as we push him up to a better level of Neurontin. If he is not showing some definite improvement will make a referral to a chronic pain clinic.  He does need a primary care physician, and the system at our practice was explained to him. He can see Dr. Merla Riches next door if appointments are available, and I am happy  to see him here in the walk-in clinic.

## 2011-06-26 ENCOUNTER — Ambulatory Visit (INDEPENDENT_AMBULATORY_CARE_PROVIDER_SITE_OTHER): Payer: Medicare Other | Admitting: Family Medicine

## 2011-06-26 VITALS — BP 117/80 | HR 83 | Temp 98.8°F | Resp 16 | Ht 67.0 in | Wt 224.0 lb

## 2011-06-26 DIAGNOSIS — M5126 Other intervertebral disc displacement, lumbar region: Secondary | ICD-10-CM

## 2011-06-26 DIAGNOSIS — E785 Hyperlipidemia, unspecified: Secondary | ICD-10-CM

## 2011-06-26 DIAGNOSIS — G894 Chronic pain syndrome: Secondary | ICD-10-CM

## 2011-06-26 DIAGNOSIS — G8929 Other chronic pain: Secondary | ICD-10-CM

## 2011-06-26 DIAGNOSIS — I339 Acute and subacute endocarditis, unspecified: Secondary | ICD-10-CM

## 2011-06-26 DIAGNOSIS — M549 Dorsalgia, unspecified: Secondary | ICD-10-CM

## 2011-06-26 DIAGNOSIS — M5116 Intervertebral disc disorders with radiculopathy, lumbar region: Secondary | ICD-10-CM

## 2011-06-26 DIAGNOSIS — I10 Essential (primary) hypertension: Secondary | ICD-10-CM

## 2011-06-26 DIAGNOSIS — I39 Endocarditis and heart valve disorders in diseases classified elsewhere: Secondary | ICD-10-CM

## 2011-06-26 MED ORDER — GABAPENTIN 300 MG PO CAPS: ORAL_CAPSULE | ORAL | Status: DC

## 2011-06-26 MED ORDER — OXYCODONE-ACETAMINOPHEN 5-325 MG PO TABS: ORAL_TABLET | ORAL | Status: DC

## 2011-06-26 NOTE — Patient Instructions (Signed)
Minimize use of the oxycodone pain pills.  If you continue to require this level of pain medication we will refer to a pain clinic

## 2011-06-26 NOTE — Progress Notes (Signed)
  Subjective:    Patient ID: Jeremiah Goodwin, male    DOB: 05-17-1955, 56 y.o.   MRN: [id number]  HPIPatient has more pain still in back radiating to L leg.  Gabepentin has helped some but still taking oxycodone.  Tramadol did not help  Wants to lose weight and get bp down and offf chol meds and bp meds    Review of Systems neg    Objective:   Physical Exam  slr sitting negative.  L knee tender.  Sensory normal      Assessment & Plan:  Lumbar disc disease with chronic pain HTN Hyperlipidemia   Wt loss, exercise  Try hydrocodone Increase gabepentin  To 1200 daily, then 1500

## 2011-06-27 LAB — LIPID PANEL
Cholesterol: 185 mg/dL (ref 0–200)
HDL: 64 mg/dL (ref >39)
Total CHOL/HDL Ratio: 2.9 Ratio
VLDL: 11 mg/dL (ref 0–40)

## 2011-06-27 LAB — COMPREHENSIVE METABOLIC PANEL
AST: 21 U/L (ref 0–37)
Alkaline Phosphatase: 80 U/L (ref 39–117)
BUN: 9 mg/dL (ref 6–23)
Creat: 1.16 mg/dL (ref 0.50–1.35)
Glucose, Bld: 91 mg/dL (ref 70–99)
Total Bilirubin: 0.2 mg/dL — ABNORMAL LOW (ref 0.3–1.2)

## 2011-06-28 ENCOUNTER — Encounter: Payer: Self-pay | Admitting: *Deleted

## 2011-09-10 ENCOUNTER — Ambulatory Visit (INDEPENDENT_AMBULATORY_CARE_PROVIDER_SITE_OTHER): Payer: Medicare Other | Admitting: Internal Medicine

## 2011-09-10 VITALS — BP 126/85 | HR 81 | Temp 98.1°F | Resp 18 | Ht 66.0 in | Wt 215.8 lb

## 2011-09-10 DIAGNOSIS — G894 Chronic pain syndrome: Secondary | ICD-10-CM

## 2011-09-10 DIAGNOSIS — M810 Age-related osteoporosis without current pathological fracture: Secondary | ICD-10-CM

## 2011-09-10 DIAGNOSIS — Z79899 Other long term (current) drug therapy: Secondary | ICD-10-CM

## 2011-09-10 DIAGNOSIS — E789 Disorder of lipoprotein metabolism, unspecified: Secondary | ICD-10-CM

## 2011-09-10 DIAGNOSIS — I10 Essential (primary) hypertension: Secondary | ICD-10-CM

## 2011-09-10 DIAGNOSIS — E782 Mixed hyperlipidemia: Secondary | ICD-10-CM

## 2011-09-10 DIAGNOSIS — G8929 Other chronic pain: Secondary | ICD-10-CM

## 2011-09-10 DIAGNOSIS — Z7189 Other specified counseling: Secondary | ICD-10-CM

## 2011-09-10 DIAGNOSIS — M5116 Intervertebral disc disorders with radiculopathy, lumbar region: Secondary | ICD-10-CM

## 2011-09-10 MED ORDER — SIMVASTATIN 20 MG PO TABS: 20.0000 mg | ORAL_TABLET | Freq: Every evening | ORAL | Status: DC

## 2011-09-10 MED ORDER — OXYCODONE-ACETAMINOPHEN 5-325 MG PO TABS: ORAL_TABLET | ORAL | Status: DC

## 2011-09-10 MED ORDER — RISEDRONATE SODIUM 35 MG PO TABS: 35.0000 mg | ORAL_TABLET | ORAL | Status: DC

## 2011-09-10 MED ORDER — METHOCARBAMOL 750 MG PO TABS: 750.0000 mg | ORAL_TABLET | Freq: Four times a day (QID) | ORAL | Status: AC

## 2011-09-10 MED ORDER — LISINOPRIL 10 MG PO TABS: 10.0000 mg | ORAL_TABLET | Freq: Every day | ORAL | Status: DC

## 2011-09-10 NOTE — Progress Notes (Signed)
  Subjective:    Patient ID: Jeremiah Goodwin, male    DOB: November 01, 1955, 56 y.o.   MRN: [id number]  HPI Hx of 7 spine surgerys. Has chronic pain and followed by Dr. Alwyn Ren now Has new surgical problem at lumbar area with L leg tingling. Needs meds refilled HTN, lipids, osteoporosis,all stable   Review of Systems No changes    Objective:   Physical Exam  Constitutional: He is oriented to person, place, and time. He appears well-developed and well-nourished. He appears distressed.  HENT:  Right Ear: External ear normal.  Left Ear: External ear normal.  Eyes: EOM are normal.  Neck: Normal range of motion. Neck supple.  Cardiovascular: Normal rate, regular rhythm and normal heart sounds.   Pulmonary/Chest: Effort normal and breath sounds normal.  Abdominal: Soft. Bowel sounds are normal.  Musculoskeletal: He exhibits tenderness.       Lumbar back: He exhibits decreased range of motion, tenderness, bony tenderness, pain and spasm.  Neurological: He is alert and oriented to person, place, and time. Coordination abnormal.  Skin: Skin is warm and dry.  Psychiatric: He has a normal mood and affect.    Lab work nl March      Assessment & Plan:  Refill all meds Schedule CPE Exercise in pool

## 2011-09-10 NOTE — Patient Instructions (Signed)

## 2011-10-08 ENCOUNTER — Encounter (HOSPITAL_COMMUNITY): Payer: Self-pay

## 2011-10-08 ENCOUNTER — Emergency Department (HOSPITAL_COMMUNITY): Payer: Medicare Other

## 2011-10-08 ENCOUNTER — Emergency Department (HOSPITAL_COMMUNITY)
Admission: EM | Admit: 2011-10-08 | Discharge: 2011-10-08 | Disposition: A | Discharge status: Home / Self Care | Payer: Medicare Other | Attending: Emergency Medicine | Admitting: Emergency Medicine

## 2011-10-08 DIAGNOSIS — I10 Essential (primary) hypertension: Secondary | ICD-10-CM | POA: Insufficient documentation

## 2011-10-08 DIAGNOSIS — M549 Dorsalgia, unspecified: Secondary | ICD-10-CM | POA: Insufficient documentation

## 2011-10-08 DIAGNOSIS — Z79899 Other long term (current) drug therapy: Secondary | ICD-10-CM | POA: Insufficient documentation

## 2011-10-08 DIAGNOSIS — M25559 Pain in unspecified hip: Secondary | ICD-10-CM | POA: Insufficient documentation

## 2011-10-08 DIAGNOSIS — E785 Hyperlipidemia, unspecified: Secondary | ICD-10-CM | POA: Insufficient documentation

## 2011-10-08 DIAGNOSIS — F17200 Nicotine dependence, unspecified, uncomplicated: Secondary | ICD-10-CM | POA: Insufficient documentation

## 2011-10-08 DIAGNOSIS — R269 Unspecified abnormalities of gait and mobility: Secondary | ICD-10-CM | POA: Insufficient documentation

## 2011-10-08 MED ORDER — OXYCODONE-ACETAMINOPHEN 5-325 MG PO TABS: 2.0000 | ORAL_TABLET | ORAL | Status: AC | PRN

## 2011-10-08 MED ORDER — KETOROLAC TROMETHAMINE 60 MG/2ML IM SOLN
60.0000 mg | Freq: Once | INTRAMUSCULAR | Status: AC
  Administered 2011-10-08: 60 mg via INTRAMUSCULAR
  Filled 2011-10-08: qty 2

## 2011-10-08 NOTE — ED Notes (Signed)
Pt in from home with left hip pain states worsening today denies recent injury states a hx of osteoporosis states no relief with pain medication, states out of pain medication would like to have pain control states unable to put weight on leg uses a can to assist with ambulation

## 2011-10-08 NOTE — Discharge Instructions (Signed)
Jeremiah Goodwin the x-ray of your hip does not show any fracture.  Take the percocet for pain but do not drive with this.  Followup with your PCP next week. Return to the ER if you become febrile or if you have severe pain. Arthralgia Arthralgia is joint pain. A joint is a place where two bones meet. Joint pain can happen for many reasons. The joint can be bruised, stiff, infected, or weak from aging. Pain usually goes away after resting and taking medicine for soreness.  HOME CARE  Rest the joint as told by your doctor.   Keep the sore joint raised (elevated) for the first 24 hours.   Put ice on the joint area.   Put ice in a plastic bag.   Place a towel between your skin and the bag.   Leave the ice on for 15 to 20 minutes, 3 to 4 times a day.   Wear your splint, casting, elastic bandage, or sling as told by your doctor.   Only take medicine as told by your doctor. Do not take aspirin.   Use crutches as told by your doctor. Do not put weight on the joint until told to by your doctor.  GET HELP RIGHT AWAY IF:   You have bruising, puffiness (swelling), or more pain.   Your fingers or toes turn blue or start to lose feeling (numb).   Your medicine does not lessen the pain.   Your pain becomes severe.   You have a temperature by mouth above 102 F (38.9 C), not controlled by medicine.   You cannot move or use the joint.  MAKE SURE YOU:   Understand these instructions.   Will watch your condition.   Will get help right away if you are not doing well or get worse.  Document Released: 03/30/2009 Document Revised: 03/31/2011 Document Reviewed: 03/30/2009 [facility] Patient Information 2012 [place], [place].

## 2011-10-08 NOTE — ED Notes (Signed)
Pt c/o chronic hip pain that has increased over last 3-4 days. Pt states pain usually comes and goes but recently has been constant and severe.

## 2011-10-11 ENCOUNTER — Ambulatory Visit (INDEPENDENT_AMBULATORY_CARE_PROVIDER_SITE_OTHER): Payer: Medicare Other | Admitting: Internal Medicine

## 2011-10-11 VITALS — BP 137/89 | HR 80 | Temp 98.7°F | Resp 18 | Wt 217.0 lb

## 2011-10-11 DIAGNOSIS — G8929 Other chronic pain: Secondary | ICD-10-CM

## 2011-10-11 DIAGNOSIS — M25559 Pain in unspecified hip: Secondary | ICD-10-CM

## 2011-10-11 DIAGNOSIS — G894 Chronic pain syndrome: Secondary | ICD-10-CM

## 2011-10-11 DIAGNOSIS — M545 Low back pain: Secondary | ICD-10-CM

## 2011-10-11 DIAGNOSIS — M5410 Radiculopathy, site unspecified: Secondary | ICD-10-CM

## 2011-10-11 MED ORDER — OXYCODONE-ACETAMINOPHEN 5-325 MG PO TABS: 1.0000 | ORAL_TABLET | Freq: Three times a day (TID) | ORAL | Status: AC | PRN

## 2011-10-11 NOTE — Progress Notes (Signed)
  Subjective:    Patient ID: Jeremiah Goodwin, male    DOB: 1955-09-10, 56 y.o.   MRN: [id number]  HPI Has chronic pain and is disabled from 8 spine surgerys. ER visit and xrs reviewed.   Review of Systems     Objective:   Physical Exam Unchanged       Assessment & Plan:  CPE scheduled May need referral to chronic pain center.

## 2011-10-12 NOTE — ED Provider Notes (Signed)
Medical screening examination/treatment/procedure(s) were performed by non-physician practitioner and as supervising physician I was immediately available for consultation/collaboration.   Gerhard Munch, MD 10/12/11 [phone]

## 2011-10-12 NOTE — ED Provider Notes (Signed)
History     CSN: [id number]  Arrival date & time 10/08/11  1736   First MD Initiated Contact with Patient 10/08/11 1959      Chief Complaint  Patient presents with  . Hip Pain    (Consider location/radiation/quality/duration/timing/severity/associated sxs/prior treatment) HPI  Past Medical History  Diagnosis Date  . Hyperlipidemia   . Chronic pain syndrome     Knees to feet and low back, on Neurontin.  Marland Kitchen Hypertension   . Allergy   . Osteoporosis     Past Surgical History  Procedure Date  . Spine surgery     Has had surgery spine and low back  . Joint replacement     Left total knee arthroplasty    History reviewed. No pertinent family history.  History  Substance Use Topics  . Smoking status: Current Everyday Smoker -- 0.3 packs/day for 42 years    Types: Cigarettes  . Smokeless tobacco: Not on file  . Alcohol Use: Yes     rarely drinks       Review of Systems  Constitutional: Negative.   HENT: Negative.   Eyes: Negative.   Respiratory: Negative.   Cardiovascular: Negative.   Gastrointestinal: Negative.  Negative for nausea, vomiting and abdominal pain.  Musculoskeletal: Positive for back pain and gait problem.       L hip pain  Neurological: Negative.  Negative for weakness and numbness.  Psychiatric/Behavioral: Negative.   All other systems reviewed and are negative.    Allergies  Hydrocodone-acetaminophen  Home Medications   Current Outpatient Rx  Name Route Sig Dispense Refill  . GABAPENTIN 300 MG PO CAPS  Take one in AM, one midday, then 2 at bedtime.  After 2 weeks increase to 2 in AM, 1 midday, and 2 at bedtime 150 capsule 2  . LISINOPRIL 10 MG PO TABS Oral Take 1 tablet (10 mg total) by mouth at bedtime. 90 tablet 3  . OMEGA-3-ACID ETHYL ESTERS 1 G PO CAPS Oral Take 1 g by mouth 4 (four) times daily.    . OXYCODONE-ACETAMINOPHEN 5-325 MG PO TABS  For severe pain only, maximum once in 12 hours, not for regular use 60 tablet 0  .  RISEDRONATE SODIUM 35 MG PO TABS Oral Take 1 tablet (35 mg total) by mouth every 7 (seven) days. with water on empty stomach, nothing by mouth or lie down for next 30 minutes. 12 tablet 3  . SIMVASTATIN 20 MG PO TABS Oral Take 1 tablet (20 mg total) by mouth every evening. 90 tablet 3  . OXYCODONE-ACETAMINOPHEN 5-325 MG PO TABS Oral Take 2 tablets by mouth every 4 (four) hours as needed for pain. 12 tablet 0  . OXYCODONE-ACETAMINOPHEN 5-325 MG PO TABS Oral Take 1 tablet by mouth every 8 (eight) hours as needed for pain. 60 tablet 0    BP 122/90  Pulse 98  Temp 99 F (37.2 C) (Oral)  Resp 18  SpO2 96%  Physical Exam  Nursing note and vitals reviewed. Constitutional: He is oriented to person, place, and time. He appears well-developed and well-nourished.  HENT:  Head: Normocephalic.  Eyes: Conjunctivae and EOM are normal. Pupils are equal, round, and reactive to light.  Neck: Normal range of motion. Neck supple.  Cardiovascular: Normal rate.   Pulmonary/Chest: Effort normal.  Abdominal: Soft.  Musculoskeletal: Normal range of motion. He exhibits tenderness.       L posterior hip with no pain radiation  Neurological: He is alert and oriented to person,  place, and time.  Skin: Skin is warm and dry.  Psychiatric: He has a normal mood and affect.    ED Course  Procedures (including critical care time)  Labs Reviewed - No data to display No results found.   1. Hip pain       MDM  Chronic hip pain after 7 back surgeries.  Recent rx for percocet #60 last month from pcp. I think he needs pain clinic.  Rx for #12 percocet today. Toridol for pain in ER.  Follow up with pcp.  L hip film unremarkable.         Remi Haggard, NP 10/12/11 1313

## 2011-10-31 ENCOUNTER — Ambulatory Visit: Payer: Medicare Other

## 2011-10-31 ENCOUNTER — Encounter: Payer: Self-pay | Admitting: Internal Medicine

## 2011-10-31 ENCOUNTER — Ambulatory Visit (INDEPENDENT_AMBULATORY_CARE_PROVIDER_SITE_OTHER): Payer: Medicare Other | Admitting: Internal Medicine

## 2011-10-31 VITALS — BP 131/81 | HR 85 | Temp 98.7°F | Resp 18 | Ht 67.0 in | Wt 214.0 lb

## 2011-10-31 DIAGNOSIS — Z125 Encounter for screening for malignant neoplasm of prostate: Secondary | ICD-10-CM

## 2011-10-31 DIAGNOSIS — Z0000 Encounter for general adult medical examination without abnormal findings: Secondary | ICD-10-CM

## 2011-10-31 DIAGNOSIS — E789 Disorder of lipoprotein metabolism, unspecified: Secondary | ICD-10-CM

## 2011-10-31 DIAGNOSIS — M729 Fibroblastic disorder, unspecified: Secondary | ICD-10-CM

## 2011-10-31 DIAGNOSIS — Z7189 Other specified counseling: Secondary | ICD-10-CM

## 2011-10-31 DIAGNOSIS — M5116 Intervertebral disc disorders with radiculopathy, lumbar region: Secondary | ICD-10-CM

## 2011-10-31 DIAGNOSIS — I10 Essential (primary) hypertension: Secondary | ICD-10-CM

## 2011-10-31 DIAGNOSIS — E785 Hyperlipidemia, unspecified: Secondary | ICD-10-CM

## 2011-10-31 DIAGNOSIS — G8929 Other chronic pain: Secondary | ICD-10-CM

## 2011-10-31 LAB — POCT URINALYSIS DIPSTICK
Blood, UA: NEGATIVE
Ketones, UA: NEGATIVE
Protein, UA: 30
Spec Grav, UA: 1.025

## 2011-10-31 LAB — COMPREHENSIVE METABOLIC PANEL
ALT: 16 U/L (ref 0–53)
CO2: 25 mEq/L (ref 19–32)
Calcium: 9.4 mg/dL (ref 8.4–10.5)
Chloride: 107 mEq/L (ref 96–112)
Creat: 1 mg/dL (ref 0.50–1.35)
Glucose, Bld: 99 mg/dL (ref 70–99)
Total Protein: 7 g/dL (ref 6.0–8.3)

## 2011-10-31 LAB — CBC WITH DIFFERENTIAL/PLATELET
Basophils Absolute: 0 10*3/uL (ref 0.0–0.1)
HCT: 41.4 % (ref 39.0–52.0)
Lymphocytes Relative: 47 % — ABNORMAL HIGH (ref 12–46)
Lymphs Abs: 2 10*3/uL (ref 0.7–4.0)
Neutro Abs: 1.4 10*3/uL — ABNORMAL LOW (ref 1.7–7.7)
Platelets: 253 10*3/uL (ref 150–400)
RBC: 4.72 MIL/uL (ref 4.22–5.81)
RDW: 13 % (ref 11.5–15.5)
WBC: 4.2 10*3/uL (ref 4.0–10.5)

## 2011-10-31 LAB — POCT UA - MICROSCOPIC ONLY
Bacteria, U Microscopic: NEGATIVE
Casts, Ur, LPF, POC: NEGATIVE
Crystals, Ur, HPF, POC: NEGATIVE

## 2011-10-31 LAB — PSA, MEDICARE: PSA: 1.09 ng/mL (ref <=4.00)

## 2011-10-31 LAB — IFOBT (OCCULT BLOOD): IFOBT: POSITIVE

## 2011-10-31 MED ORDER — OXYCODONE-ACETAMINOPHEN 10-325 MG PO TABS: 1.0000 | ORAL_TABLET | Freq: Three times a day (TID) | ORAL | Status: AC | PRN

## 2011-10-31 NOTE — Progress Notes (Signed)
  Subjective:    Patient ID: Jeremiah Goodwin, male    DOB: 11-May-1955, 56 y.o.   MRN: [id number]  HPI See scanned hx In pain all the time from 8 spine surgerys.  Review of Systems See scanned ros    Objective:   Physical Exam  Constitutional: He is oriented to person, place, and time. He appears well-nourished. He appears distressed.  HENT:  Right Ear: External ear normal.  Left Ear: External ear normal.  Mouth/Throat: Oropharynx is clear and moist.  Eyes: EOM are normal. Pupils are equal, round, and reactive to light. No scleral icterus.  Neck: Normal range of motion. No tracheal deviation present. No thyromegaly present.  Cardiovascular: Normal rate, regular rhythm and normal heart sounds.   Pulmonary/Chest: Effort normal. No respiratory distress. He has wheezes.  Abdominal: Soft. Bowel sounds are normal. He exhibits no distension and no mass.  Genitourinary: Rectum normal, prostate normal and penis normal.  Musculoskeletal: He exhibits tenderness.       Left knee: He exhibits decreased range of motion and deformity.       Cervical back: He exhibits decreased range of motion, tenderness, bony tenderness, deformity and spasm.       Lumbar back: He exhibits decreased range of motion, tenderness, deformity, pain and spasm.  Lymphadenopathy:    He has no cervical adenopathy.  Neurological: He is alert and oriented to person, place, and time. He displays atrophy and abnormal reflex. A sensory deficit is present. No cranial nerve deficit. He exhibits abnormal muscle tone. Coordination and gait abnormal.  Skin: Skin is warm and dry.  Psychiatric: He has a normal mood and affect. His behavior is normal. Judgment and thought content normal.   In pain Scars cspine and ls spine 8 surgerys   [facility] reading (PRIMARY) by  Dr.Mykiah Schmuck cxr nad. ekg ok Results for orders placed in visit on 10/31/11  POCT UA - MICROSCOPIC ONLY      Component Value Range   WBC, Ur, HPF, POC 0-1     RBC, urine,  microscopic 0-2     Bacteria, U Microscopic neg     Mucus, UA large     Epithelial cells, urine per micros 0-3     Crystals, Ur, HPF, POC neg     Casts, Ur, LPF, POC neg     Yeast, UA neg    POCT URINALYSIS DIPSTICK      Component Value Range   Color, UA yellow     Clarity, UA clear     Glucose, UA neg     Bilirubin, UA small     Ketones, UA neg     Spec Grav, UA 1.025     Blood, UA neg     pH, UA 6.5     Protein, UA 30     Urobilinogen, UA 2.0     Nitrite, UA neg     Leukocytes, UA Negative         Assessment & Plan:  See Dr. Venetia Maxon NS RF meds 1 year

## 2011-11-23 ENCOUNTER — Telehealth: Payer: Self-pay

## 2011-11-23 NOTE — Telephone Encounter (Signed)
Patient asking for ortho referral, last note indicated pain management referral may be needed, is ortho appropriate?

## 2011-11-23 NOTE — Telephone Encounter (Signed)
The patient called to request referral to an orthopaedic office for back, hip and knee pain.  The patient stated he was in a lot of pain, unable to lay flat in bed, unable to function normally or work.   Please call the patient at [phone].

## 2011-11-24 NOTE — Telephone Encounter (Signed)
At the visit here 10/31/2011 the patient was to follow-up with Dr. Venetia Maxon (NS). (OV 6/18 says maybe pain management) Both visits were with Dr. Perrin Maltese.

## 2011-11-25 NOTE — Telephone Encounter (Signed)
Called pt to advise on this, he has plans to come in on Aug 8th and will discuss with Dr Perrin Maltese. He was given Dr Perrin Maltese hours for this day.

## 2011-12-01 ENCOUNTER — Ambulatory Visit (INDEPENDENT_AMBULATORY_CARE_PROVIDER_SITE_OTHER): Payer: Medicare Other | Admitting: Internal Medicine

## 2011-12-01 VITALS — BP 116/76 | HR 90 | Temp 98.0°F | Resp 18 | Ht 67.0 in | Wt 209.0 lb

## 2011-12-01 DIAGNOSIS — M545 Low back pain: Secondary | ICD-10-CM

## 2011-12-01 DIAGNOSIS — Z7189 Other specified counseling: Secondary | ICD-10-CM

## 2011-12-01 DIAGNOSIS — M79605 Pain in left leg: Secondary | ICD-10-CM

## 2011-12-01 MED ORDER — OXYCODONE-ACETAMINOPHEN 10-325 MG PO TABS: 1.0000 | ORAL_TABLET | Freq: Three times a day (TID) | ORAL | Status: AC | PRN

## 2011-12-01 NOTE — Progress Notes (Signed)
  Subjective:    Patient ID: Jeremiah Goodwin, male    DOB: 12-Aug-1955, 56 y.o.   MRN: [id number]  HPI Pain and disability is progressive, left lb and down leg. Needs to revisit NS for ideas Will continue to RF pain meds for a while but may need Pain Clinic Has lost 6 lbs   Review of Systems     Objective:   Physical Exam Gait worse, on cane and walker Left leg radiculopathy       Assessment & Plan:  RF to Dr. Venetia Maxon RF oxycodone Swim for exercises

## 2011-12-21 ENCOUNTER — Ambulatory Visit (INDEPENDENT_AMBULATORY_CARE_PROVIDER_SITE_OTHER): Payer: Medicare Other | Admitting: Family Medicine

## 2011-12-21 VITALS — BP 119/76 | HR 102 | Temp 98.1°F | Resp 16 | Ht 67.0 in | Wt 214.0 lb

## 2011-12-21 DIAGNOSIS — M545 Low back pain, unspecified: Secondary | ICD-10-CM

## 2011-12-21 DIAGNOSIS — Z7189 Other specified counseling: Secondary | ICD-10-CM

## 2011-12-21 DIAGNOSIS — M79609 Pain in unspecified limb: Secondary | ICD-10-CM

## 2011-12-21 DIAGNOSIS — G8929 Other chronic pain: Secondary | ICD-10-CM

## 2011-12-21 DIAGNOSIS — M25562 Pain in left knee: Secondary | ICD-10-CM

## 2011-12-21 DIAGNOSIS — I10 Essential (primary) hypertension: Secondary | ICD-10-CM

## 2011-12-21 DIAGNOSIS — E789 Disorder of lipoprotein metabolism, unspecified: Secondary | ICD-10-CM

## 2011-12-21 DIAGNOSIS — M5116 Intervertebral disc disorders with radiculopathy, lumbar region: Secondary | ICD-10-CM

## 2011-12-21 MED ORDER — OXYCODONE-ACETAMINOPHEN 10-325 MG PO TABS: 1.0000 | ORAL_TABLET | Freq: Three times a day (TID) | ORAL | Status: DC | PRN

## 2011-12-21 MED ORDER — METHYLPREDNISOLONE ACETATE 80 MG/ML IJ SUSP
80.0000 mg | Freq: Once | INTRAMUSCULAR | Status: AC
  Administered 2011-12-21: 80 mg via INTRA_ARTICULAR

## 2011-12-21 MED ORDER — BACLOFEN 10 MG PO TABS: 10.0000 mg | ORAL_TABLET | Freq: Three times a day (TID) | ORAL | Status: AC

## 2011-12-21 NOTE — Progress Notes (Signed)
**Note Jeremiah-Identified via Obfuscation** This is a 56 y.o.male who has chronic back pain with 7 prior surgeries over 25 years.   Patient has a past history of low back pain for which takes percocet  Jeremiah Goodwin denies any urinary symptoms, bowel problems, numbness in the legs, loss of motor power. Jeremiah Goodwin had no fever.  Jeremiah Goodwin has tried baclofen as well.  Left knee is chronically sore, but recently swelled.  Past Medical History  Diagnosis Date  . Hyperlipidemia   . Chronic pain syndrome     Knees to feet and low back, on Neurontin.  Marland Kitchen Hypertension   . Allergy   . Osteoporosis      Past Surgical History  Procedure Date  . Spine surgery     Has had surgery spine and low back  . Joint replacement     Left total knee arthroplasty  . Back surgery     8 different times    Objective:  middle-aged male in no acute distress. Blood pressure 119/76, pulse 102, temperature 98.1 F (36.7 C), temperature source Oral, resp. rate 16, height 5\' 7"  (1.702 m), weight 214 lb (97.07 kg).Body mass index is 33.52 kg/(m^2). Palpation of the back reveals no  pain  Inspection of the back: Reveals no scoliosis  Straight-leg raising: mild pain on left    Motor exam of lower extremity: No abnormal weakness.  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  Cannot fully extend the knee.  No localized tenderness.  Marked "clunking" crepitus  Assessment/Plan: Acute lower back pain without acute neurological findings. Mild effusion left knee.  The crepitus of the left knee is definitely not normal for a TKR.  I've asked patient to return to Dr. Lequita Halt for recheck of the knee.   Left knee injected with 1 cc marcaine and 1/2 cc depomedrol under sterile technique with good pain relief

## 2011-12-21 NOTE — Patient Instructions (Addendum)
**Note Jeremiah-Identified via Obfuscation** his is a 56 y.o.male who has chronic back pain with 7 prior surgeries over 25 years.   Patient has a past history of low back pain for which takes percocet  Jeremiah Goodwin denies any urinary symptoms, bowel problems, numbness in the legs, loss of motor power. Jeremiah Goodwin had no fever.  Jeremiah Goodwin has tried baclofen as well.  Left knee is chronically sore, but recently swelled.  Past Medical History  Diagnosis Date  . Hyperlipidemia   . Chronic pain syndrome     Knees to feet and low back, on Neurontin.  Marland Kitchen Hypertension   . Allergy   . Osteoporosis      Past Surgical History  Procedure Date  . Spine surgery     Has had surgery spine and low back  . Joint replacement     Left total knee arthroplasty  . Back surgery     8 different times    Objective:  middle-aged male in no acute distress. Blood pressure 119/76, pulse 102, temperature 98.1 F (36.7 C), temperature source Oral, resp. rate 16, height 5\' 7"  (1.702 m), weight 214 lb (97.07 kg).Body mass index is 33.52 kg/(m^2). Palpation of the back reveals no  pain  Inspection of the back: Reveals no scoliosis  Straight-leg raising: mild pain on left    Motor exam of lower extremity: No abnormal weakness.  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  Cannot fully extend the knee.  No localized tenderness.  Marked "clunking" crepitus  Assessment/Plan: Acute lower back pain without acute neurological findings. Mild effusion left knee.  The crepitus of the left knee is definitely not normal for a TKR.  I've asked patient to return to Dr. Lequita Halt for recheck of the knee.   Left knee injected with 1 cc marcaine and 1/2 cc depomedrol under sterile technique with good pain relief

## 2012-01-19 ENCOUNTER — Other Ambulatory Visit: Payer: Self-pay

## 2012-01-19 MED ORDER — METHOCARBAMOL 750 MG PO TABS: 750.0000 mg | ORAL_TABLET | Freq: Four times a day (QID) | ORAL | Status: DC

## 2012-01-25 ENCOUNTER — Ambulatory Visit (INDEPENDENT_AMBULATORY_CARE_PROVIDER_SITE_OTHER): Payer: Medicare Other | Admitting: Family Medicine

## 2012-01-25 VITALS — BP 146/82 | HR 91 | Temp 98.4°F | Resp 18 | Ht 67.0 in | Wt 214.6 lb

## 2012-01-25 DIAGNOSIS — M5116 Intervertebral disc disorders with radiculopathy, lumbar region: Secondary | ICD-10-CM

## 2012-01-25 DIAGNOSIS — M5126 Other intervertebral disc displacement, lumbar region: Secondary | ICD-10-CM

## 2012-01-25 DIAGNOSIS — G8929 Other chronic pain: Secondary | ICD-10-CM

## 2012-01-25 MED ORDER — OXYCODONE-ACETAMINOPHEN 10-325 MG PO TABS: 1.0000 | ORAL_TABLET | Freq: Three times a day (TID) | ORAL | Status: AC | PRN

## 2012-01-25 NOTE — Progress Notes (Signed)
56 yo who has been bothered by left knee pain chronically.  He saw Dr. Deri Fuelling PA who injected his left hip yesterday but the pain is worse.  He was told the knee pain was referred from the hip.  He will be seeing Dr. Venetia Maxon the  17th of November.  Wearing the left knee brace.  Objective:  NAD Cannot do SLR because of hip pain on left  Assessment:  Chronic pain in process of evaluation  1. Lumbar disc disease with radiculopathy  oxyCODONE-acetaminophen (PERCOCET) 10-325 MG per tablet  2. Chronic pain  oxyCODONE-acetaminophen (PERCOCET) 10-325 MG per tablet   Continue using the cane and follow up with orthopedics in 3 weeks.

## 2012-01-25 NOTE — Patient Instructions (Addendum)

## 2012-02-11 ENCOUNTER — Other Ambulatory Visit: Payer: Self-pay | Admitting: *Deleted

## 2012-02-11 DIAGNOSIS — Z7189 Other specified counseling: Secondary | ICD-10-CM

## 2012-02-11 DIAGNOSIS — M5116 Intervertebral disc disorders with radiculopathy, lumbar region: Secondary | ICD-10-CM

## 2012-02-11 DIAGNOSIS — I10 Essential (primary) hypertension: Secondary | ICD-10-CM

## 2012-02-11 DIAGNOSIS — E789 Disorder of lipoprotein metabolism, unspecified: Secondary | ICD-10-CM

## 2012-02-11 DIAGNOSIS — G8929 Other chronic pain: Secondary | ICD-10-CM

## 2012-02-11 MED ORDER — OMEGA-3-ACID ETHYL ESTERS 1 G PO CAPS: 2.0000 g | ORAL_CAPSULE | Freq: Two times a day (BID) | ORAL | Status: DC

## 2012-02-11 MED ORDER — METHOCARBAMOL 750 MG PO TABS: 750.0000 mg | ORAL_TABLET | Freq: Four times a day (QID) | ORAL | Status: DC

## 2012-02-12 ENCOUNTER — Other Ambulatory Visit: Payer: Self-pay | Admitting: *Deleted

## 2012-02-12 DIAGNOSIS — I10 Essential (primary) hypertension: Secondary | ICD-10-CM

## 2012-02-12 DIAGNOSIS — G8929 Other chronic pain: Secondary | ICD-10-CM

## 2012-02-12 DIAGNOSIS — M5116 Intervertebral disc disorders with radiculopathy, lumbar region: Secondary | ICD-10-CM

## 2012-02-12 DIAGNOSIS — Z7189 Other specified counseling: Secondary | ICD-10-CM

## 2012-02-12 DIAGNOSIS — E789 Disorder of lipoprotein metabolism, unspecified: Secondary | ICD-10-CM

## 2012-02-12 MED ORDER — METHOCARBAMOL 750 MG PO TABS: 750.0000 mg | ORAL_TABLET | Freq: Four times a day (QID) | ORAL | Status: DC

## 2012-02-12 MED ORDER — OMEGA-3-ACID ETHYL ESTERS 1 G PO CAPS: 2.0000 g | ORAL_CAPSULE | Freq: Two times a day (BID) | ORAL | Status: DC

## 2012-02-29 ENCOUNTER — Other Ambulatory Visit: Payer: Self-pay | Admitting: Radiology

## 2012-03-12 ENCOUNTER — Telehealth: Payer: Self-pay | Admitting: *Deleted

## 2012-03-12 NOTE — Telephone Encounter (Signed)
Should be getting from ortho.

## 2012-03-12 NOTE — Telephone Encounter (Signed)
Pharmacy requests refill on methocarbamol 750mg  qid.

## 2012-03-13 NOTE — Telephone Encounter (Signed)
Patient advised. He has also been taking Baclofen he is going to call Dr Lequita Halt and find out if he is to take the robaxin or the baclofen.

## 2012-03-16 ENCOUNTER — Ambulatory Visit (INDEPENDENT_AMBULATORY_CARE_PROVIDER_SITE_OTHER): Payer: Medicare Other | Admitting: Internal Medicine

## 2012-03-16 ENCOUNTER — Telehealth: Payer: Self-pay

## 2012-03-16 VITALS — BP 166/102 | HR 86 | Temp 97.9°F | Resp 16 | Ht 67.0 in | Wt 220.0 lb

## 2012-03-16 DIAGNOSIS — H9313 Tinnitus, bilateral: Secondary | ICD-10-CM

## 2012-03-16 DIAGNOSIS — G894 Chronic pain syndrome: Secondary | ICD-10-CM

## 2012-03-16 DIAGNOSIS — H9319 Tinnitus, unspecified ear: Secondary | ICD-10-CM

## 2012-03-16 DIAGNOSIS — M160 Bilateral primary osteoarthritis of hip: Secondary | ICD-10-CM

## 2012-03-16 DIAGNOSIS — M169 Osteoarthritis of hip, unspecified: Secondary | ICD-10-CM

## 2012-03-16 DIAGNOSIS — I10 Essential (primary) hypertension: Secondary | ICD-10-CM | POA: Insufficient documentation

## 2012-03-16 DIAGNOSIS — M25559 Pain in unspecified hip: Secondary | ICD-10-CM

## 2012-03-16 MED ORDER — AMITRIPTYLINE HCL 25 MG PO TABS: 25.0000 mg | ORAL_TABLET | Freq: Every day | ORAL | Status: DC

## 2012-03-16 MED ORDER — METHYLPREDNISOLONE ACETATE 80 MG/ML IJ SUSP
120.0000 mg | Freq: Once | INTRAMUSCULAR | Status: AC
  Administered 2012-03-16: 120 mg via INTRAMUSCULAR

## 2012-03-16 MED ORDER — GABAPENTIN 300 MG PO CAPS: ORAL_CAPSULE | ORAL | Status: DC

## 2012-03-16 NOTE — Patient Instructions (Addendum)

## 2012-03-16 NOTE — Progress Notes (Signed)
  Subjective:    Patient ID: Jeremiah Goodwin, male    DOB: 10/16/1955, 56 y.o.   MRN: [id number]  HPI Worried about bp and new ringing in ears. Not using asa. No ha, feels ok, is scheduled for total hip, Dr Despina Hick. No neuro sxs. Needs rfs on some chronic meds.   Review of Systems     Objective:   Physical Exam  Constitutional: He is oriented to person, place, and time. He appears well-nourished.  HENT:  Right Ear: Hearing, tympanic membrane, external ear and ear canal normal.  Left Ear: Hearing, tympanic membrane, external ear and ear canal normal.  Nose: Nose normal.  Mouth/Throat: Oropharynx is clear and moist.  Eyes: EOM are normal.  Neck: Neck supple.  Cardiovascular: Normal rate, regular rhythm and normal heart sounds.   Pulmonary/Chest: Effort normal.  Musculoskeletal: He exhibits tenderness.  Neurological: He is alert and oriented to person, place, and time. No cranial nerve deficit. He exhibits normal muscle tone. Coordination normal.  Skin: Skin is warm and dry.  Psychiatric: He has a normal mood and affect.          Assessment & Plan:  Depomedrol 120mg  for tinnitis RF to ENT prn RF meds  HTN controlled

## 2012-03-20 ENCOUNTER — Other Ambulatory Visit: Payer: Self-pay | Admitting: Orthopedic Surgery

## 2012-03-20 MED ORDER — BUPIVACAINE LIPOSOME 1.3 % IJ SUSP: 20.0000 mL | Freq: Once | INTRAMUSCULAR | Status: DC

## 2012-03-20 MED ORDER — DEXAMETHASONE SODIUM PHOSPHATE 10 MG/ML IJ SOLN: 10.0000 mg | Freq: Once | INTRAMUSCULAR | Status: DC

## 2012-03-20 NOTE — Progress Notes (Signed)
Preoperative surgical orders have been place into the [facility] hospital system for Jeremiah Goodwin on 03/20/2012, 5:31 PM  by Patrica Duel for surgery on 05/04/2012.  Preop Total Hip orders including Experel Injecion, IV Tylenol, and IV Decadron as long as there are no contraindications to the above medications. [name], PA-C

## 2012-03-22 NOTE — Telephone Encounter (Signed)
DONE IN ERROR.  MBC   °

## 2012-04-23 ENCOUNTER — Other Ambulatory Visit: Payer: Self-pay | Admitting: Orthopedic Surgery

## 2012-04-23 ENCOUNTER — Encounter (HOSPITAL_COMMUNITY): Payer: Self-pay | Admitting: Pharmacy Technician

## 2012-04-23 NOTE — H&P (Signed)
**Note Jeremiah-Identified via Obfuscation** Jeremiah Goodwin  DOB: 10-03-1955 Married / Language: English / Race: Black or African American Male  Date of Admission: 05/04/2012  Chief Complaint:  Left Hip Pain  History of Present Illness The patient is a 56 year old male who comes in for a preoperative History and Physical. The patient is scheduled for a left total hip arthroplasty to be performed by Dr. Gus Rankin. Aluisio, MD at [facility] on 05/04/2012. The patient is a 56 year old male who presents today for follow up of their hip. The patient is being followed for their left hip pain. They are 3 week(s) out from intra articular injection. Note for "Follow-up Hip": He states the injection did help, he is able to walk now without a cane. He is trying to exercise daily by walking and riding a bike.  His hip and knee were hurting him much more before the injection. The injection definitely helped both pains. He still has discomfort especially in the hip. It is really limiting what he can and cannot do. He is ready for the total hip replacement surgery. They have been treated conservatively in the past for the above stated problem and despite conservative measures, they continue to have progressive pain and severe functional limitations and dysfunction. They have failed non-operative management including home exercise, medications. It is felt that they would benefit from undergoing total joint replacement. Risks and benefits of the procedure have been discussed with the patient and they elect to proceed with surgery. There are no active contraindications to surgery such as ongoing infection or rapidly progressive neurological disease.   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 Itching.   Family History Father. Living, In good health. age 61 Mother. Deceased. age 30   Social History Tobacco use. Current every day smoker. Post-Surgical Plans. Home versus  Rehab Marital status. Married. Children. 6 Children (one child is deceased)   Medication History OxyCODONE HCl (5MG  Tablet, 1-2 Tablet Oral po bid prn pain, Taken starting 04/10/2012) Active. Robaxin (500MG  Tablet, 1 (one) Tablet Oral three times daily, Taken starting 04/10/2012) Active. Gabapentin (300MG  Capsule, Oral) Active. Actonel (35MG  Tablet, Oral) Active. Lovaza (1GM Capsule, Oral) Active. Lisinopril (10MG  Tablet, Oral) Active.   Past Surgical History Neck Disc Surgery Low Back Disc Surgery  Medical History OSTEOPOROSIS HYPERTENSION Hypercholesterolemia   Review of Systems General:Not Present- Chills, Fever, Night Sweats, Fatigue, Weight Gain, Weight Loss and Memory Loss. Skin:Not Present- Hives, Itching, Rash, Eczema and Lesions. HEENT:Not Present- Tinnitus, Headache, Double Vision, Visual Loss, Hearing Loss and Dentures. Respiratory:Not Present- Shortness of breath with exertion, Shortness of breath at rest, Allergies, Coughing up blood and Chronic Cough. Cardiovascular:Not Present- Chest Pain, Racing/skipping heartbeats, Difficulty Breathing Lying Down, Murmur, Swelling and Palpitations. Gastrointestinal:Not Present- Bloody Stool, Heartburn, Abdominal Pain, Vomiting, Nausea, Constipation, Diarrhea, Difficulty Swallowing, Jaundice and Loss of appetitie. Male Genitourinary:Not Present- Urinary frequency, Blood in Urine, Weak urinary stream, Discharge, Flank Pain, Incontinence, Painful Urination, Urgency, Urinary Retention and Urinating at Night. Musculoskeletal:Not Present- Muscle Weakness, Muscle Pain, Joint Swelling, Joint Pain, Back Pain, Morning Stiffness and Spasms. Neurological:Not Present- Tremor, Dizziness, Blackout spells, Paralysis, Difficulty with balance and Weakness. Psychiatric:Not Present- Insomnia.   Vitals Weight: 220 lb Height: 67 in Weight was reported by patient. Height was reported by patient. Body Surface Area: 2.17 m Body  Mass Index: 34.46 kg/m Pulse: 84 (Regular) Resp.: 14 (Unlabored) BP: 138/82 (Sitting, Right Arm, Standard)    Physical Exam The physical exam findings are as follows:  Note: Patient is a 56 year old male with continued hip pain.   General Mental Status - Alert, cooperative and good historian. General Appearance- pleasant. Not in acute distress. Orientation- Oriented X3. Build & Nutrition- Well nourished and Well developed.   Head and Neck Head- normocephalic, atraumatic . Neck Global Assessment- supple. no bruit auscultated on the right and no bruit auscultated on the left.   Eye Pupil- Bilateral- Regular and Round. Motion- Bilateral- EOMI.   Chest and Lung Exam Auscultation: Breath sounds:- clear at anterior chest wall and - clear at posterior chest wall. Adventitious sounds:- No Adventitious sounds.   Cardiovascular Auscultation:Rhythm- Regular rate and rhythm. Heart Sounds- S1 WNL and S2 WNL. Murmurs & Other Heart Sounds:Auscultation of the heart reveals - No Murmurs.   Abdomen Palpation/Percussion:Tenderness- Abdomen is non-tender to palpation. Rigidity (guarding)- Abdomen is soft. Auscultation:Auscultation of the abdomen reveals - Bowel sounds normal.   Male Genitourinary  Not done, not pertinent to present illness  Musculoskeletal On exam, he is alert and oriented in no apparent distress. The left hip can be flexed to 100. No internal rotation. About 10-20 external rotation and 10-20 abduction. The right hip has normal range of motion.  RADIOGRAPHS: AP pelvis and lateral of the left hip show he has concentric narrowing just about bone on bone but not fully bone on bone at this time. He has some subchondral cystic formation also.  Assessment & Plan Osteoarthritis, Hip (715.35) Impression: Left Hip  Note: Plan is for a Left Total Hip Replacement by Dr. Lequita Halt.  Plan is to go home versus going into rehab  following the hospital stay.  PCP - Dr. Perrin Maltese - Patient has been seen preoperatively and felt to be stable for surgery.  Signed electronically by Roberts Gaudy, PA-C

## 2012-04-26 ENCOUNTER — Encounter (HOSPITAL_COMMUNITY)
Admission: RE | Admit: 2012-04-26 | Discharge: 2012-04-26 | Disposition: A | Discharge status: Home / Self Care | Payer: Medicare Other | Source: Ambulatory Visit | Attending: Orthopedic Surgery | Admitting: Orthopedic Surgery

## 2012-04-26 ENCOUNTER — Other Ambulatory Visit (HOSPITAL_COMMUNITY): Payer: Self-pay | Admitting: *Deleted

## 2012-04-26 ENCOUNTER — Encounter (HOSPITAL_COMMUNITY): Payer: Self-pay

## 2012-04-26 ENCOUNTER — Ambulatory Visit (HOSPITAL_COMMUNITY)
Admission: RE | Admit: 2012-04-26 | Discharge: 2012-04-26 | Disposition: A | Discharge status: Home / Self Care | Payer: Medicare Other | Source: Ambulatory Visit | Attending: Orthopedic Surgery | Admitting: Orthopedic Surgery

## 2012-04-26 DIAGNOSIS — Z01818 Encounter for other preprocedural examination: Secondary | ICD-10-CM | POA: Insufficient documentation

## 2012-04-26 LAB — CBC
HCT: 41.7 % (ref 39.0–52.0)
MCH: 30.9 pg (ref 26.0–34.0)
MCHC: 35 g/dL (ref 30.0–36.0)
MCV: 88.3 fL (ref 78.0–100.0)
RDW: 12.6 % (ref 11.5–15.5)

## 2012-04-26 LAB — COMPREHENSIVE METABOLIC PANEL
Albumin: 3.9 g/dL (ref 3.5–5.2)
BUN: 12 mg/dL (ref 6–23)
Creatinine, Ser: 1 mg/dL (ref 0.50–1.35)
Total Bilirubin: 0.2 mg/dL — ABNORMAL LOW (ref 0.3–1.2)
Total Protein: 7.5 g/dL (ref 6.0–8.3)

## 2012-04-26 LAB — URINALYSIS, ROUTINE W REFLEX MICROSCOPIC
Bilirubin Urine: NEGATIVE
Nitrite: NEGATIVE
Specific Gravity, Urine: 1.004 — ABNORMAL LOW (ref 1.005–1.030)
Urobilinogen, UA: 0.2 mg/dL (ref 0.0–1.0)

## 2012-04-26 LAB — PROTIME-INR
INR: 0.88 (ref 0.00–1.49)
Prothrombin Time: 11.9 seconds (ref 11.6–15.2)

## 2012-04-26 LAB — SURGICAL PCR SCREEN: MRSA, PCR: NEGATIVE

## 2012-04-26 NOTE — Patient Instructions (Addendum)
20      Your procedure is scheduled on:  Friday 05/04/2012  Report to [facility] at 0515 AM.  Call this number if you have problems the morning of surgery: [phone]   Remember:   Do not eat food or drink liquids after midnight!  Take these medicines the morning of surgery with A SIP OF WATER: Neurontin,Oxy-IR   Do not bring valuables to the hospital.  .  Leave suitcase in the car. After surgery it may be brought to your room.  For patients admitted to the hospital, checkout time is 11:00 AM the day of              Discharge.    Special Instructions: See [facility] Preparing  For Surgery Instruction Sheet. Do not wear jewelry, lotions powders, perfumes. Women do not shave legs or underarms for 12 hours before showers. Contacts, partial plates,                 or dentures may not be worn into surgery.                          Patients discharged the day of surgery will not be allowed to drive home. If going home the same day of surgery, must have someone stay with you first 24 hrs.at home and arrange for someone to drive you home from                  the Hospital.                          YOUR DRIVER WU:XLKGMW Wall-sister   Please read over the following fact sheets that you were given: MRSA  INFORMATION,INCENTIVE SPIROMETRY SHEET, SLEEP APNEA SHEET                            Telford Nab.Aleksey Newbern,RN,BSN     [id number]                FAILURE TO FOLLOW THESE INSTRUCTIONS MAY RESULT IN CANCELLATION OF YOUR SURGERY!               Patient Signature:___________________________

## 2012-04-26 NOTE — Progress Notes (Signed)
Clearance note Dr Perrin Maltese 7/13 on chart, chest x ray, EKG 7/13 EPIC  As well as LOV Dr Perrin Maltese 11/13

## 2012-04-27 ENCOUNTER — Inpatient Hospital Stay (HOSPITAL_COMMUNITY): Admission: RE | Admit: 2012-04-27 | Payer: Medicare Other | Source: Ambulatory Visit

## 2012-05-04 ENCOUNTER — Encounter (HOSPITAL_COMMUNITY)
Admission: RE | Disposition: A | Discharge status: Skilled Nursing Facility | Payer: Self-pay | Source: Ambulatory Visit | Attending: Orthopedic Surgery

## 2012-05-04 ENCOUNTER — Inpatient Hospital Stay (HOSPITAL_COMMUNITY): Payer: Medicare Other

## 2012-05-04 ENCOUNTER — Encounter (HOSPITAL_COMMUNITY): Payer: Self-pay | Admitting: *Deleted

## 2012-05-04 ENCOUNTER — Inpatient Hospital Stay (HOSPITAL_COMMUNITY)
Admission: RE | Admit: 2012-05-04 | Discharge: 2012-05-07 | DRG: 470 | Disposition: A | Discharge status: Skilled Nursing Facility | Payer: Medicare Other | Source: Ambulatory Visit | Attending: Orthopedic Surgery | Admitting: Orthopedic Surgery

## 2012-05-04 ENCOUNTER — Encounter (HOSPITAL_COMMUNITY): Payer: Self-pay | Admitting: Anesthesiology

## 2012-05-04 ENCOUNTER — Inpatient Hospital Stay (HOSPITAL_COMMUNITY): Payer: Medicare Other | Admitting: Anesthesiology

## 2012-05-04 DIAGNOSIS — Z79899 Other long term (current) drug therapy: Secondary | ICD-10-CM

## 2012-05-04 DIAGNOSIS — G894 Chronic pain syndrome: Secondary | ICD-10-CM | POA: Diagnosis present

## 2012-05-04 DIAGNOSIS — M160 Bilateral primary osteoarthritis of hip: Secondary | ICD-10-CM

## 2012-05-04 DIAGNOSIS — E789 Disorder of lipoprotein metabolism, unspecified: Secondary | ICD-10-CM

## 2012-05-04 DIAGNOSIS — E7800 Pure hypercholesterolemia, unspecified: Secondary | ICD-10-CM | POA: Diagnosis present

## 2012-05-04 DIAGNOSIS — M25559 Pain in unspecified hip: Secondary | ICD-10-CM

## 2012-05-04 DIAGNOSIS — I10 Essential (primary) hypertension: Secondary | ICD-10-CM | POA: Diagnosis present

## 2012-05-04 DIAGNOSIS — G8929 Other chronic pain: Secondary | ICD-10-CM

## 2012-05-04 DIAGNOSIS — Z7189 Other specified counseling: Secondary | ICD-10-CM

## 2012-05-04 DIAGNOSIS — Z96649 Presence of unspecified artificial hip joint: Secondary | ICD-10-CM

## 2012-05-04 DIAGNOSIS — F329 Major depressive disorder, single episode, unspecified: Secondary | ICD-10-CM | POA: Diagnosis present

## 2012-05-04 DIAGNOSIS — H9313 Tinnitus, bilateral: Secondary | ICD-10-CM

## 2012-05-04 DIAGNOSIS — M5116 Intervertebral disc disorders with radiculopathy, lumbar region: Secondary | ICD-10-CM

## 2012-05-04 DIAGNOSIS — M1610 Unilateral primary osteoarthritis, unspecified hip: Principal | ICD-10-CM | POA: Diagnosis present

## 2012-05-04 DIAGNOSIS — M169 Osteoarthritis of hip, unspecified: Principal | ICD-10-CM | POA: Diagnosis present

## 2012-05-04 DIAGNOSIS — F17200 Nicotine dependence, unspecified, uncomplicated: Secondary | ICD-10-CM | POA: Diagnosis present

## 2012-05-04 DIAGNOSIS — F3289 Other specified depressive episodes: Secondary | ICD-10-CM | POA: Diagnosis present

## 2012-05-04 HISTORY — PX: TOTAL HIP ARTHROPLASTY: SHX124

## 2012-05-04 LAB — TYPE AND SCREEN: Antibody Screen: NEGATIVE

## 2012-05-04 SURGERY — ARTHROPLASTY, HIP, TOTAL,POSTERIOR APPROACH
Anesthesia: General | Site: Hip | Laterality: Left | Wound class: Clean

## 2012-05-04 MED ORDER — BLISTEX EX OINT
TOPICAL_OINTMENT | CUTANEOUS | Status: AC
  Filled 2012-05-04: qty 10

## 2012-05-04 MED ORDER — SUCCINYLCHOLINE CHLORIDE 20 MG/ML IJ SOLN
INTRAMUSCULAR | Status: DC | PRN
  Administered 2012-05-04: 140 mg via INTRAVENOUS

## 2012-05-04 MED ORDER — MENTHOL 3 MG MT LOZG: 1.0000 | LOZENGE | OROMUCOSAL | Status: DC | PRN

## 2012-05-04 MED ORDER — DIPHENHYDRAMINE HCL 12.5 MG/5ML PO ELIX: 12.5000 mg | ORAL_SOLUTION | ORAL | Status: DC | PRN

## 2012-05-04 MED ORDER — ACETAMINOPHEN 325 MG PO TABS: 650.0000 mg | ORAL_TABLET | Freq: Four times a day (QID) | ORAL | Status: DC | PRN

## 2012-05-04 MED ORDER — GABAPENTIN 300 MG PO CAPS
300.0000 mg | ORAL_CAPSULE | ORAL | Status: DC
  Administered 2012-05-04 – 2012-05-07 (×4): 300 mg via ORAL
  Filled 2012-05-04 (×4): qty 1

## 2012-05-04 MED ORDER — HYDROMORPHONE HCL PF 1 MG/ML IJ SOLN
INTRAMUSCULAR | Status: AC
  Filled 2012-05-04: qty 1

## 2012-05-04 MED ORDER — FLEET ENEMA 7-19 GM/118ML RE ENEM: 1.0000 | ENEMA | Freq: Once | RECTAL | Status: AC | PRN

## 2012-05-04 MED ORDER — PROMETHAZINE HCL 25 MG/ML IJ SOLN: 6.2500 mg | INTRAMUSCULAR | Status: DC | PRN

## 2012-05-04 MED ORDER — METOCLOPRAMIDE HCL 10 MG PO TABS: 5.0000 mg | ORAL_TABLET | Freq: Three times a day (TID) | ORAL | Status: DC | PRN

## 2012-05-04 MED ORDER — BUPIVACAINE LIPOSOME 1.3 % IJ SUSP
20.0000 mL | Freq: Once | INTRAMUSCULAR | Status: AC
  Administered 2012-05-04: 70 mL
  Filled 2012-05-04: qty 20

## 2012-05-04 MED ORDER — BISACODYL 10 MG RE SUPP: 10.0000 mg | Freq: Every day | RECTAL | Status: DC | PRN

## 2012-05-04 MED ORDER — DEXAMETHASONE 4 MG PO TABS
10.0000 mg | ORAL_TABLET | Freq: Once | ORAL | Status: AC
  Administered 2012-05-05: 10 mg via ORAL
  Filled 2012-05-04: qty 1

## 2012-05-04 MED ORDER — ROCURONIUM BROMIDE 100 MG/10ML IV SOLN
INTRAVENOUS | Status: DC | PRN
  Administered 2012-05-04: 30 mg via INTRAVENOUS

## 2012-05-04 MED ORDER — SODIUM CHLORIDE 0.9 % IV SOLN: INTRAVENOUS | Status: DC

## 2012-05-04 MED ORDER — LACTATED RINGERS IV SOLN: INTRAVENOUS | Status: DC

## 2012-05-04 MED ORDER — METOCLOPRAMIDE HCL 5 MG/ML IJ SOLN: 5.0000 mg | Freq: Three times a day (TID) | INTRAMUSCULAR | Status: DC | PRN

## 2012-05-04 MED ORDER — METHOCARBAMOL 100 MG/ML IJ SOLN
500.0000 mg | Freq: Four times a day (QID) | INTRAVENOUS | Status: DC | PRN
  Administered 2012-05-04: 500 mg via INTRAVENOUS
  Filled 2012-05-04: qty 5

## 2012-05-04 MED ORDER — ONDANSETRON HCL 4 MG/2ML IJ SOLN
INTRAMUSCULAR | Status: DC | PRN
  Administered 2012-05-04: 4 mg via INTRAVENOUS

## 2012-05-04 MED ORDER — MIDAZOLAM HCL 5 MG/5ML IJ SOLN
INTRAMUSCULAR | Status: DC | PRN
  Administered 2012-05-04: 2 mg via INTRAVENOUS

## 2012-05-04 MED ORDER — FENTANYL CITRATE 0.05 MG/ML IJ SOLN
INTRAMUSCULAR | Status: DC | PRN
  Administered 2012-05-04 (×2): 100 ug via INTRAVENOUS
  Administered 2012-05-04: 50 ug via INTRAVENOUS
  Administered 2012-05-04: 100 ug via INTRAVENOUS

## 2012-05-04 MED ORDER — RIVAROXABAN 10 MG PO TABS
10.0000 mg | ORAL_TABLET | Freq: Every day | ORAL | Status: DC
  Administered 2012-05-05 – 2012-05-07 (×3): 10 mg via ORAL
  Filled 2012-05-04 (×4): qty 1

## 2012-05-04 MED ORDER — GABAPENTIN 300 MG PO CAPS
600.0000 mg | ORAL_CAPSULE | Freq: Every day | ORAL | Status: DC
  Administered 2012-05-04 – 2012-05-06 (×3): 600 mg via ORAL
  Filled 2012-05-04 (×4): qty 2

## 2012-05-04 MED ORDER — GLYCOPYRROLATE 0.2 MG/ML IJ SOLN
INTRAMUSCULAR | Status: DC | PRN
  Administered 2012-05-04: 0.4 mg via INTRAVENOUS

## 2012-05-04 MED ORDER — SIMVASTATIN 20 MG PO TABS
20.0000 mg | ORAL_TABLET | Freq: Every day | ORAL | Status: DC
  Administered 2012-05-04 – 2012-05-06 (×3): 20 mg via ORAL
  Filled 2012-05-04 (×4): qty 1

## 2012-05-04 MED ORDER — CEFAZOLIN SODIUM-DEXTROSE 2-3 GM-% IV SOLR
2.0000 g | INTRAVENOUS | Status: AC
  Administered 2012-05-04: 2 g via INTRAVENOUS

## 2012-05-04 MED ORDER — ACETAMINOPHEN 10 MG/ML IV SOLN
1000.0000 mg | Freq: Once | INTRAVENOUS | Status: AC
  Administered 2012-05-04: 1000 mg via INTRAVENOUS

## 2012-05-04 MED ORDER — PHENOL 1.4 % MT LIQD: 1.0000 | OROMUCOSAL | Status: DC | PRN

## 2012-05-04 MED ORDER — PHENYLEPHRINE HCL 10 MG/ML IJ SOLN
INTRAMUSCULAR | Status: DC | PRN
  Administered 2012-05-04: 40 ug via INTRAVENOUS

## 2012-05-04 MED ORDER — ONDANSETRON HCL 4 MG PO TABS: 4.0000 mg | ORAL_TABLET | Freq: Four times a day (QID) | ORAL | Status: DC | PRN

## 2012-05-04 MED ORDER — LACTATED RINGERS IV SOLN
INTRAVENOUS | Status: DC | PRN
  Administered 2012-05-04 (×3): via INTRAVENOUS

## 2012-05-04 MED ORDER — GABAPENTIN 300 MG PO CAPS
600.0000 mg | ORAL_CAPSULE | Freq: Every day | ORAL | Status: DC
  Administered 2012-05-05 – 2012-05-07 (×3): 600 mg via ORAL
  Filled 2012-05-04 (×4): qty 2

## 2012-05-04 MED ORDER — KCL IN DEXTROSE-NACL 20-5-0.9 MEQ/L-%-% IV SOLN
INTRAVENOUS | Status: AC
  Administered 2012-05-04: 1000 mL
  Filled 2012-05-04: qty 1000

## 2012-05-04 MED ORDER — KCL IN DEXTROSE-NACL 20-5-0.9 MEQ/L-%-% IV SOLN
INTRAVENOUS | Status: DC
  Administered 2012-05-04 (×2): via INTRAVENOUS
  Filled 2012-05-04 (×9): qty 1000

## 2012-05-04 MED ORDER — HYDROMORPHONE HCL PF 1 MG/ML IJ SOLN
0.2500 mg | INTRAMUSCULAR | Status: DC | PRN
  Administered 2012-05-04 (×2): 0.5 mg via INTRAVENOUS

## 2012-05-04 MED ORDER — DOCUSATE SODIUM 100 MG PO CAPS
100.0000 mg | ORAL_CAPSULE | Freq: Two times a day (BID) | ORAL | Status: DC
  Administered 2012-05-04 – 2012-05-07 (×7): 100 mg via ORAL

## 2012-05-04 MED ORDER — ACETAMINOPHEN 10 MG/ML IV SOLN
1000.0000 mg | Freq: Four times a day (QID) | INTRAVENOUS | Status: AC
  Administered 2012-05-04 – 2012-05-05 (×4): 1000 mg via INTRAVENOUS
  Filled 2012-05-04 (×8): qty 100

## 2012-05-04 MED ORDER — ONDANSETRON HCL 4 MG/2ML IJ SOLN
4.0000 mg | Freq: Four times a day (QID) | INTRAMUSCULAR | Status: DC | PRN
  Administered 2012-05-04: 4 mg via INTRAVENOUS

## 2012-05-04 MED ORDER — PROPOFOL 10 MG/ML IV BOLUS
INTRAVENOUS | Status: DC | PRN
  Administered 2012-05-04: 200 mg via INTRAVENOUS

## 2012-05-04 MED ORDER — LIDOCAINE HCL 4 % MT SOLN
OROMUCOSAL | Status: DC | PRN
  Administered 2012-05-04: 4 mL via TOPICAL

## 2012-05-04 MED ORDER — DEXAMETHASONE SODIUM PHOSPHATE 10 MG/ML IJ SOLN: 10.0000 mg | Freq: Once | INTRAMUSCULAR | Status: AC

## 2012-05-04 MED ORDER — METHOCARBAMOL 500 MG PO TABS
500.0000 mg | ORAL_TABLET | Freq: Four times a day (QID) | ORAL | Status: DC | PRN
  Administered 2012-05-04 – 2012-05-07 (×6): 500 mg via ORAL
  Filled 2012-05-04 (×9): qty 1

## 2012-05-04 MED ORDER — HYDROMORPHONE HCL PF 1 MG/ML IJ SOLN
INTRAMUSCULAR | Status: DC | PRN
  Administered 2012-05-04: 0.5 mg via INTRAVENOUS
  Administered 2012-05-04: 1 mg via INTRAVENOUS
  Administered 2012-05-04: 0.5 mg via INTRAVENOUS

## 2012-05-04 MED ORDER — AMITRIPTYLINE HCL 25 MG PO TABS
25.0000 mg | ORAL_TABLET | Freq: Every day | ORAL | Status: DC
  Administered 2012-05-04 – 2012-05-06 (×3): 25 mg via ORAL
  Filled 2012-05-04 (×4): qty 1

## 2012-05-04 MED ORDER — OXYCODONE HCL 5 MG PO TABS
5.0000 mg | ORAL_TABLET | ORAL | Status: DC | PRN
  Administered 2012-05-04 (×2): 5 mg via ORAL
  Administered 2012-05-04: 10 mg via ORAL
  Administered 2012-05-04: 15 mg via ORAL
  Administered 2012-05-05: 10 mg via ORAL
  Administered 2012-05-05: 15 mg via ORAL
  Administered 2012-05-05 (×3): 10 mg via ORAL
  Administered 2012-05-06 – 2012-05-07 (×8): 15 mg via ORAL
  Filled 2012-05-04 (×2): qty 3
  Filled 2012-05-04: qty 2
  Filled 2012-05-04: qty 3
  Filled 2012-05-04: qty 2
  Filled 2012-05-04: qty 4
  Filled 2012-05-04 (×2): qty 3
  Filled 2012-05-04: qty 2
  Filled 2012-05-04: qty 1
  Filled 2012-05-04: qty 3
  Filled 2012-05-04: qty 4
  Filled 2012-05-04 (×2): qty 2
  Filled 2012-05-04: qty 3
  Filled 2012-05-04: qty 1
  Filled 2012-05-04: qty 3

## 2012-05-04 MED ORDER — ACETAMINOPHEN 650 MG RE SUPP: 650.0000 mg | Freq: Four times a day (QID) | RECTAL | Status: DC | PRN

## 2012-05-04 MED ORDER — MORPHINE SULFATE 2 MG/ML IJ SOLN
1.0000 mg | INTRAMUSCULAR | Status: DC | PRN
  Administered 2012-05-04: 1 mg via INTRAVENOUS
  Filled 2012-05-04: qty 1

## 2012-05-04 MED ORDER — CEFAZOLIN SODIUM-DEXTROSE 2-3 GM-% IV SOLR
2.0000 g | Freq: Four times a day (QID) | INTRAVENOUS | Status: AC
  Administered 2012-05-04 (×2): 2 g via INTRAVENOUS
  Filled 2012-05-04 (×2): qty 50

## 2012-05-04 MED ORDER — TRAMADOL HCL 50 MG PO TABS: 50.0000 mg | ORAL_TABLET | Freq: Four times a day (QID) | ORAL | Status: DC | PRN

## 2012-05-04 MED ORDER — POLYETHYLENE GLYCOL 3350 17 G PO PACK
17.0000 g | PACK | Freq: Every day | ORAL | Status: DC | PRN
  Administered 2012-05-06 – 2012-05-07 (×2): 17 g via ORAL

## 2012-05-04 MED ORDER — CHLORHEXIDINE GLUCONATE 4 % EX LIQD
60.0000 mL | Freq: Once | CUTANEOUS | Status: DC
  Filled 2012-05-04: qty 60

## 2012-05-04 MED ORDER — NEOSTIGMINE METHYLSULFATE 1 MG/ML IJ SOLN
INTRAMUSCULAR | Status: DC | PRN
  Administered 2012-05-04: 3 mg via INTRAVENOUS

## 2012-05-04 SURGICAL SUPPLY — 50 items
BAG SPEC THK2 15X12 ZIP CLS (MISCELLANEOUS) ×1
BAG ZIPLOCK 12X15 (MISCELLANEOUS) ×2 IMPLANT
BIT DRILL 2.8X128 (BIT) ×2 IMPLANT
BLADE EXTENDED COATED 6.5IN (ELECTRODE) ×2 IMPLANT
BLADE SAW SAG 73X25 THK (BLADE) ×1
BLADE SAW SGTL 73X25 THK (BLADE) ×1 IMPLANT
CLOTH BEACON ORANGE TIMEOUT ST (SAFETY) ×2 IMPLANT
DECANTER SPIKE VIAL GLASS SM (MISCELLANEOUS) ×2 IMPLANT
DRAPE INCISE IOBAN 66X45 STRL (DRAPES) ×2 IMPLANT
DRAPE ORTHO SPLIT 77X108 STRL (DRAPES) ×4
DRAPE POUCH INSTRU U-SHP 10X18 (DRAPES) ×2 IMPLANT
DRAPE SURG ORHT 6 SPLT 77X108 (DRAPES) ×2 IMPLANT
DRAPE U-SHAPE 47X51 STRL (DRAPES) ×2 IMPLANT
DRSG ADAPTIC 3X8 NADH LF (GAUZE/BANDAGES/DRESSINGS) ×2 IMPLANT
DRSG MEPILEX BORDER 4X4 (GAUZE/BANDAGES/DRESSINGS) ×2 IMPLANT
DRSG MEPILEX BORDER 4X8 (GAUZE/BANDAGES/DRESSINGS) ×2 IMPLANT
DURAPREP 26ML APPLICATOR (WOUND CARE) ×2 IMPLANT
ELECT REM PT RETURN 9FT ADLT (ELECTROSURGICAL) ×2
ELECTRODE REM PT RTRN 9FT ADLT (ELECTROSURGICAL) ×1 IMPLANT
EVACUATOR 1/8 PVC DRAIN (DRAIN) ×2 IMPLANT
FACESHIELD LNG OPTICON STERILE (SAFETY) ×8 IMPLANT
GLOVE BIO SURGEON STRL SZ8 (GLOVE) ×6 IMPLANT
GLOVE BIOGEL PI IND STRL 8 (GLOVE) ×2 IMPLANT
GLOVE BIOGEL PI INDICATOR 8 (GLOVE) ×2
GLOVE ECLIPSE 8.0 STRL XLNG CF (GLOVE) ×2 IMPLANT
GLOVE SURG SS PI 6.5 STRL IVOR (GLOVE) ×4 IMPLANT
GOWN STRL NON-REIN LRG LVL3 (GOWN DISPOSABLE) ×4 IMPLANT
GOWN STRL REIN XL XLG (GOWN DISPOSABLE) ×2 IMPLANT
IMMOBILIZER KNEE 20 (SOFTGOODS) ×2
IMMOBILIZER KNEE 20 THIGH 36 (SOFTGOODS) IMPLANT
KIT BASIN OR (CUSTOM PROCEDURE TRAY) ×2 IMPLANT
MANIFOLD NEPTUNE II (INSTRUMENTS) ×2 IMPLANT
NDL SAFETY ECLIPSE 18X1.5 (NEEDLE) ×1 IMPLANT
NEEDLE HYPO 18GX1.5 SHARP (NEEDLE) ×2
NS IRRIG 1000ML POUR BTL (IV SOLUTION) ×2 IMPLANT
PACK TOTAL JOINT (CUSTOM PROCEDURE TRAY) ×2 IMPLANT
PASSER SUT SWANSON 36MM LOOP (INSTRUMENTS) ×2 IMPLANT
POSITIONER SURGICAL ARM (MISCELLANEOUS) ×2 IMPLANT
SPONGE GAUZE 4X4 12PLY (GAUZE/BANDAGES/DRESSINGS) ×2 IMPLANT
STRIP CLOSURE SKIN 1/2X4 (GAUZE/BANDAGES/DRESSINGS) ×4 IMPLANT
SUT ETHIBOND NAB CT1 #1 30IN (SUTURE) ×4 IMPLANT
SUT MNCRL AB 4-0 PS2 18 (SUTURE) ×2 IMPLANT
SUT VIC AB 2-0 CT1 27 (SUTURE) ×6
SUT VIC AB 2-0 CT1 TAPERPNT 27 (SUTURE) ×3 IMPLANT
SUT VLOC 180 0 24IN GS25 (SUTURE) ×5 IMPLANT
SYR 50ML LL SCALE MARK (SYRINGE) ×2 IMPLANT
TOWEL OR 17X26 10 PK STRL BLUE (TOWEL DISPOSABLE) ×4 IMPLANT
TOWEL OR NON WOVEN STRL DISP B (DISPOSABLE) ×2 IMPLANT
TRAY FOLEY CATH 14FRSI W/METER (CATHETERS) ×2 IMPLANT
WATER STERILE IRR 1500ML POUR (IV SOLUTION) ×3 IMPLANT

## 2012-05-04 NOTE — Progress Notes (Signed)
Clinical Social Work Department BRIEF PSYCHOSOCIAL ASSESSMENT 05/04/2012  Patient:  Jeremiah Goodwin, Jeremiah Goodwin     Account Number:  [id number]     Admit date:  05/04/2012  Clinical Social Worker:  Candie Chroman  Date/Time:  05/04/2012 02:58 PM  Referred by:  Physician  Date Referred:  05/04/2012 Referred for  SNF Placement   Other Referral:   Interview type:  Patient Other interview type:    PSYCHOSOCIAL DATA Living Status:  ALONE Admitted from facility:   Level of care:   Primary support name:  Pearla Dubonnet Primary support relationship to patient:  SIBLING Degree of support available:   unclear    CURRENT CONCERNS Current Concerns  Post-Acute Placement   Other Concerns:    SOCIAL WORK ASSESSMENT / PLAN Pt is a 57 yr old gentleman living at home prior to hospitalization. CSW met with pt to assist with d/c planning. ST Rehab is needed following hospital d/c. SNF search has been initiated and bed offers will be provided as received. CSW will follow to assist with d/c planning to SNF.   Assessment/plan status:  Psychosocial Support/Ongoing Assessment of Needs Other assessment/ plan:   Information/referral to community resources:   SNF list provided. Insurance coverage reviewed.    PATIENT'S/FAMILY'S RESPONSE TO PLAN OF CARE: Pt is disappointed that [facility] is not contracted with [facility]. He was hoping to have ST Rehab at that facility. Pt will consider other SNF options.    Cori Razor LCSW [phone]

## 2012-05-04 NOTE — Progress Notes (Signed)
Utilization review completed.  

## 2012-05-04 NOTE — Progress Notes (Signed)
Clinical Social Work Department CLINICAL SOCIAL WORK PLACEMENT NOTE 05/04/2012  Patient:  Jeremiah Goodwin, Jeremiah Goodwin  Account Number:  [id number] Admit date:  05/04/2012  Clinical Social Worker:  Cori Razor, LCSW  Date/time:  05/04/2012 03:05 PM  Clinical Social Work is seeking post-discharge placement for this patient at the following level of care:   SKILLED NURSING   (*CSW will update this form in Epic as items are completed)   05/04/2012  Patient/family provided with [facility] Department of Clinical Social Work's list of facilities offering this level of care within the geographic area requested by the patient (or if unable, by the patient's family).  05/04/2012  Patient/family informed of their freedom to choose among providers that offer the needed level of care, that participate in Medicare, Medicaid or managed care program needed by the patient, have an available bed and are willing to accept the patient.    Patient/family informed of [facility]' ownership interest in [facility] as well as of the fact that they are under no obligation to receive care at this facility.  PASARR submitted to EDS on 05/04/2012 PASARR number received from EDS on   FL2 transmitted to all facilities in geographic area requested by pt/family on  05/04/2012 FL2 transmitted to all facilities within larger geographic area on   Patient informed that his/her managed care company has contracts with or will negotiate with  certain facilities, including the following:     Patient/family informed of bed offers received:   Patient chooses bed at  Physician recommends and patient chooses bed at    Patient to be transferred to  on   Patient to be transferred to facility by   The following physician request were entered in Epic:   Additional Comments:  Cori Razor LCSW [phone]

## 2012-05-04 NOTE — H&P (View-Only) (Signed)
Jeremiah Goodwin  DOB: 09/18/1955 Married / Language: English / Race: Black or African American Male  Date of Admission: 05/04/2012  Chief Complaint:  Left Hip Pain  History of Present Illness The patient is a 57 year old male who comes in for a preoperative History and Physical. The patient is scheduled for a left total hip arthroplasty to be performed by Dr. Frank V. Aluisio, MD at [facility] on 05/04/2012. The patient is a 57 year old male who presents today for follow up of their hip. The patient is being followed for their left hip pain. They are 3 week(s) out from intra articular injection. Note for "Follow-up Hip": He states the injection did help, he is able to walk now without a cane. He is trying to exercise daily by walking and riding a bike.  His hip and knee were hurting him much more before the injection. The injection definitely helped both pains. He still has discomfort especially in the hip. It is really limiting what he can and cannot do. He is ready for the total hip replacement surgery. They have been treated conservatively in the past for the above stated problem and despite conservative measures, they continue to have progressive pain and severe functional limitations and dysfunction. They have failed non-operative management including home exercise, medications. It is felt that they would benefit from undergoing total joint replacement. Risks and benefits of the procedure have been discussed with the patient and they elect to proceed with surgery. There are no active contraindications to surgery such as ongoing infection or rapidly progressive neurological disease.   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 Itching.   Family History Father. Living, In good health. age 75 Mother. Deceased. age 74   Social History Tobacco use. Current every day smoker. Post-Surgical Plans. Home versus  Rehab Marital status. Married. Children. 6 Children (one child is deceased)   Medication History OxyCODONE HCl (5MG Tablet, 1-2 Tablet Oral po bid prn pain, Taken starting 04/10/2012) Active. Robaxin (500MG Tablet, 1 (one) Tablet Oral three times daily, Taken starting 04/10/2012) Active. Gabapentin (300MG Capsule, Oral) Active. Actonel (35MG Tablet, Oral) Active. Lovaza (1GM Capsule, Oral) Active. Lisinopril (10MG Tablet, Oral) Active.   Past Surgical History Neck Disc Surgery Low Back Disc Surgery  Medical History OSTEOPOROSIS HYPERTENSION Hypercholesterolemia   Review of Systems General:Not Present- Chills, Fever, Night Sweats, Fatigue, Weight Gain, Weight Loss and Memory Loss. Skin:Not Present- Hives, Itching, Rash, Eczema and Lesions. HEENT:Not Present- Tinnitus, Headache, Double Vision, Visual Loss, Hearing Loss and Dentures. Respiratory:Not Present- Shortness of breath with exertion, Shortness of breath at rest, Allergies, Coughing up blood and Chronic Cough. Cardiovascular:Not Present- Chest Pain, Racing/skipping heartbeats, Difficulty Breathing Lying Down, Murmur, Swelling and Palpitations. Gastrointestinal:Not Present- Bloody Stool, Heartburn, Abdominal Pain, Vomiting, Nausea, Constipation, Diarrhea, Difficulty Swallowing, Jaundice and Loss of appetitie. Male Genitourinary:Not Present- Urinary frequency, Blood in Urine, Weak urinary stream, Discharge, Flank Pain, Incontinence, Painful Urination, Urgency, Urinary Retention and Urinating at Night. Musculoskeletal:Not Present- Muscle Weakness, Muscle Pain, Joint Swelling, Joint Pain, Back Pain, Morning Stiffness and Spasms. Neurological:Not Present- Tremor, Dizziness, Blackout spells, Paralysis, Difficulty with balance and Weakness. Psychiatric:Not Present- Insomnia.   Vitals Weight: 220 lb Height: 67 in Weight was reported by patient. Height was reported by patient. Body Surface Area: 2.17 m Body  Mass Index: 34.46 kg/m Pulse: 84 (Regular) Resp.: 14 (Unlabored) BP: 138/82 (Sitting, Right Arm, Standard)    Physical Exam The physical exam findings are as follows:    Note: Patient is a 57 year old male with continued hip pain.   General Mental Status - Alert, cooperative and good historian. General Appearance- pleasant. Not in acute distress. Orientation- Oriented X3. Build & Nutrition- Well nourished and Well developed.   Head and Neck Head- normocephalic, atraumatic . Neck Global Assessment- supple. no bruit auscultated on the right and no bruit auscultated on the left.   Eye Pupil- Bilateral- Regular and Round. Motion- Bilateral- EOMI.   Chest and Lung Exam Auscultation: Breath sounds:- clear at anterior chest wall and - clear at posterior chest wall. Adventitious sounds:- No Adventitious sounds.   Cardiovascular Auscultation:Rhythm- Regular rate and rhythm. Heart Sounds- S1 WNL and S2 WNL. Murmurs & Other Heart Sounds:Auscultation of the heart reveals - No Murmurs.   Abdomen Palpation/Percussion:Tenderness- Abdomen is non-tender to palpation. Rigidity (guarding)- Abdomen is soft. Auscultation:Auscultation of the abdomen reveals - Bowel sounds normal.   Male Genitourinary  Not done, not pertinent to present illness  Musculoskeletal On exam, he is alert and oriented in no apparent distress. The left hip can be flexed to 100. No internal rotation. About 10-20 external rotation and 10-20 abduction. The right hip has normal range of motion.  RADIOGRAPHS: AP pelvis and lateral of the left hip show he has concentric narrowing just about bone on bone but not fully bone on bone at this time. He has some subchondral cystic formation also.  Assessment & Plan Osteoarthritis, Hip (715.35) Impression: Left Hip  Note: Plan is for a Left Total Hip Replacement by Dr. Aluisio.  Plan is to go home versus going into rehab  following the hospital stay.  PCP - Dr. Guest - Patient has been seen preoperatively and felt to be stable for surgery.  Signed electronically by DREW L PERKINS, PA-C 

## 2012-05-04 NOTE — Op Note (Signed)
Pre-operative diagnosis- Osteoarthritis Left hip  Post-operative diagnosis- Osteoarthritis  Left hip  Procedure-  LeftTotal Hip Arthroplasty  Surgeon- Jeremiah Rankin. Ethelle Ola, MD  Assistant- Leilani Able, PA-C   Anesthesia  General  EBL- 500   Drain Hemovac   Complication- None  Condition-PACU - hemodynamically stable.   Brief Clinical Note- Jeremiah Goodwin is a 57 y.o. male with end stage arthritis of his left hip with progressively worsening pain and dysfunction. Pain occurs with activity and rest including pain at night. He has tried analgesics, protected weight bearing and rest without benefit. Pain is too severe to attempt physical therapy. Radiographs demonstrate bone on bone arthritis with subchondral cyst formation. He presents now for left THA.  Procedure in detail-   The patient is brought into the operating room and placed on the operating table. After successful administration of General  anesthesia, the patient is placed in the  Right lateral decubitus position with the  Left side up and held in place with the hip positioner. The lower extremity is isolated from the perineum with plastic drapes and time-out is performed by the surgical team. The lower extremity is then prepped and draped in the usual sterile fashion. A short posterolateral incision is made with a ten blade through the subcutaneous tissue to the level of the fascia lata which is incised in line with the skin incision. The sciatic nerve is palpated and protected and the short external rotators and capsule are isolated from the femur. The hip is then dislocated and the center of the femoral head is marked. A trial prosthesis is placed such that the trial head corresponds to the center of the patients' native femoral head. The resection level is marked on the femoral neck and the resection is made with an oscillating saw. The femoral head is removed and femoral retractors placed to gain access to the femoral canal.      The  canal finder is passed into the femoral canal and the canal is thoroughly irrigated with sterile saline to remove the fatty contents. Axial reaming is performed to 13.5  mm, proximal reaming to 74F  and the sleeve machined to a large. A 18 F large trial sleeve is placed into the proximal femur.      The femur is then retracted anteriorly to gain acetabular exposure. Acetabular retractors are placed and the labrum and osteophytes are removed, Acetabular reaming is performed to 53  mm and a 54  mm Pinnacle acetabular shell is placed in anatomic position with excellent purchase. Additional dome screws were not needed. The permanent 36 mm neutral + 4 Marathon liner is placed into the acetabular shell.      The trial femur is then placed into the femoral canal. The size is 18 x 13  stem with a 36 + 12  neck and a 36 + 0 head with the neck version 10 degrees deyond  the patients' native anteversion. The hip is reduced with excellent stability with full extension and full external rotation, 70 degrees flexion with 40 degrees adduction and 90 degrees internal rotation and 90 degrees of flexion with 70 degrees of internal rotation. The operative leg is placed on top of the non-operative leg and the leg lengths are found to be equal. The trials are then removed and the permanent implant of the same size is impacted into the femoral canal. The ceramic femoral head of the same size as the trial is placed and the hip is reduced with the same  stability parameters. The operative leg is again placed on top of the non-operative leg and the leg lengths are found to be equal.      The wound is then copiously irrigated with saline solution and the capsule and short external rotators are re-attached to the femur through drill holes with Ethibond suture. The fascia lata is closed over a hemovac drain with #1 vicryl suture and the fascia lata, gluteal muscles and subcutaneous tissues are injected with Exparel 20ml diluted with saline  50ml. The subcutaneous tissues are closed with #1 and2-0 vicryl and the subcuticular layer closed with running 4-0 Monocryl. The drain is hooked to suction, incision cleaned and dried, and steri-srips and a bulky sterile dressing applied. The limb is placed into a knee immobilizer and the patient is awakened and transported to recovery in stable condition.      Please note that a surgical assistant was a medical necessity for this procedure in order to perform it in a safe and expeditious manner. The assistant was necessary to provide retraction to the vital neurovascular structures and to retract and position the limb to allow for anatomic placement of the prosthetic components.  Jeremiah Rankin Bobbijo Holst, MD    05/04/2012, 8:38 AM

## 2012-05-04 NOTE — Anesthesia Preprocedure Evaluation (Addendum)
Anesthesia Evaluation  Patient identified by MRN, date of birth, ID band Patient awake    Reviewed: Allergy & Precautions, H&P , NPO status , Patient's Chart, lab work & pertinent test results  Airway Mallampati: II TM Distance: >3 FB Neck ROM: Full    Dental  (+) Teeth Intact and Dental Advisory Given,    Pulmonary Current Smoker,  breath sounds clear to auscultation  Pulmonary exam normal       Cardiovascular hypertension, Pt. on medications Rhythm:Regular Rate:Normal     Neuro/Psych Anxiety Depression Chronic Pain    GI/Hepatic Neg liver ROS, GERD-  Medicated,  Endo/Other  Morbid obesity  Renal/GU negative Renal ROS  negative genitourinary   Musculoskeletal negative musculoskeletal ROS (+)   Abdominal   Peds  Hematology negative hematology ROS (+)   Anesthesia Other Findings   Reproductive/Obstetrics negative OB ROS                          Anesthesia Physical Anesthesia Plan  ASA: II  Anesthesia Plan: General   Post-op Pain Management:    Induction: Intravenous  Airway Management Planned: Oral ETT  Additional Equipment:   Intra-op Plan:   Post-operative Plan: Extubation in OR  Informed Consent: I have reviewed the patients History and Physical, chart, labs and discussed the procedure including the risks, benefits and alternatives for the proposed anesthesia with the patient or authorized representative who has indicated his/her understanding and acceptance.   Dental advisory given  Plan Discussed with: CRNA  Anesthesia Plan Comments:         Anesthesia Quick Evaluation

## 2012-05-04 NOTE — Anesthesia Postprocedure Evaluation (Signed)
Anesthesia Post Note  Patient: Jeremiah Goodwin  Procedure(s) Performed: Procedure(s) (LRB): TOTAL HIP ARTHROPLASTY (Left)  Anesthesia type: General  Patient location: PACU  Post pain: Pain level controlled  Post assessment: Post-op Vital signs reviewed  Last Vitals:  Filed Vitals:   05/04/12 0930  BP: 120/95  Pulse: 75  Temp: 36.5 C  Resp: 15    Post vital signs: Reviewed  Level of consciousness: sedated  Complications: No apparent anesthesia complications

## 2012-05-04 NOTE — Interval H&P Note (Signed)
**Note Jeremiah-Identified via Obfuscation** History and Physical Interval Note:  05/04/2012 7:11 AM  Jeremiah Goodwin  has presented today for surgery, with the diagnosis of Osteoarthritis of the Left Hip  The various methods of treatment have been discussed with the patient and family. After consideration of risks, benefits and other options for treatment, the patient has consented to  Procedure(s) (LRB) with comments: TOTAL HIP ARTHROPLASTY (Left) as a surgical intervention .  The patient's history has been reviewed, patient examined, no change in status, stable for surgery.  I have reviewed the patient's chart and labs.  Questions were answered to the patient's satisfaction.     Loanne Drilling

## 2012-05-04 NOTE — Plan of Care (Signed)
Problem: Consults Goal: Diagnosis- Total Joint Replacement Primary Total Hip     

## 2012-05-04 NOTE — Preoperative (Signed)
Beta Blockers   Reason not to administer Beta Blockers:Not Applicable 

## 2012-05-04 NOTE — Addendum Note (Signed)
Addendum  created 05/04/12 1010 by Thornell Mule, CRNA   Modules edited:Charges VN

## 2012-05-04 NOTE — Transfer of Care (Signed)
Immediate Anesthesia Transfer of Care Note  Patient: Jeremiah Goodwin  Procedure(s) Performed: Procedure(s) (LRB) with comments: TOTAL HIP ARTHROPLASTY (Left)  Patient Location: PACU  Anesthesia Type:General  Level of Consciousness: awake, alert , oriented and patient cooperative  Airway & Oxygen Therapy: Patient Spontanous Breathing and Patient connected to face mask oxygen  Post-op Assessment: Report given to PACU RN, Post -op Vital signs reviewed and stable and Patient moving all extremities X 4  Post vital signs: Reviewed and stable  Complications: No apparent anesthesia complications

## 2012-05-05 LAB — CBC
MCHC: 33.2 g/dL (ref 30.0–36.0)
Platelets: 203 10*3/uL (ref 150–400)
RDW: 12.8 % (ref 11.5–15.5)
WBC: 6.9 10*3/uL (ref 4.0–10.5)

## 2012-05-05 LAB — BASIC METABOLIC PANEL
Chloride: 100 mEq/L (ref 96–112)
GFR calc Af Amer: >90 mL/min (ref >90)
GFR calc non Af Amer: >90 mL/min (ref >90)
Potassium: 4.3 mEq/L (ref 3.5–5.1)
Sodium: 136 mEq/L (ref 135–145)

## 2012-05-05 NOTE — Evaluation (Signed)
Physical Therapy Evaluation Patient Details Name: Jeremiah Goodwin MRN: [id number] DOB: 06-18-1955 Today's Date: 05/05/2012 Time: 6962-9528 PT Time Calculation (min): 25 min  PT Assessment / Plan / Recommendation Clinical Impression  Pt presents s/p L THA (posterior) POD 1 with decreased strength, ROM and mobility.  Pt with increased pain during session and states that she has had several back surgeries in past.  Tolerated OOB and ambulation into hallway with +2 assist for safety.  Pt will benefit from skilled PT in acute venue to address deficits.  PT recommends SNF for follow up at D/C to maximize pts safety and independence.     PT Assessment  Patient needs continued PT services    Follow Up Recommendations  SNF;Supervision/Assistance - 24 hour    Does the patient have the potential to tolerate intense rehabilitation      Barriers to Discharge Decreased caregiver support      Equipment Recommendations  Rolling walker with 5" wheels    Recommendations for Other Services     Frequency 7X/week    Precautions / Restrictions Precautions Precautions: Posterior Hip;Fall Precaution Booklet Issued: Yes (comment) Precaution Comments: Provided handout and education.  Restrictions Weight Bearing Restrictions: No Other Position/Activity Restrictions: WBAT   Pertinent Vitals/Pain 7/10, ice packs applied.       Mobility  Bed Mobility Bed Mobility: Supine to Sit Supine to Sit: 4: Min assist;HOB elevated;With rails Details for Bed Mobility Assistance: Assist for LLE out of bed with cues for hand placement to self assist trunk.  Transfers Transfers: Sit to Stand;Stand to Sit Sit to Stand: 1: +2 Total assist;From elevated surface;With upper extremity assist;From bed Sit to Stand: Patient Percentage: 50% Stand to Sit: 1: +2 Total assist;With upper extremity assist;With armrests;To chair/3-in-1 Stand to Sit: Patient Percentage: 50% Details for Transfer Assistance: Assist to rise and  steady with cues for hand placement and LE management to maintain THP.   Ambulation/Gait Ambulation/Gait Assistance: 1: +2 Total assist Ambulation/Gait: Patient Percentage: 60% Ambulation Distance (Feet): 20 Feet Assistive device: Rolling walker Ambulation/Gait Assistance Details: Assist to steady throughout with cues for sequencing/technique with RW, maintaining THP and upright posture throughout.  Gait Pattern: Step-to pattern;Decreased stride length;Trunk flexed Stairs: No Wheelchair Mobility Wheelchair Mobility: No    Shoulder Instructions     Exercises     PT Diagnosis: Difficulty walking;Generalized weakness;Acute pain  PT Problem List: Decreased strength;Decreased range of motion;Decreased activity tolerance;Decreased balance;Decreased mobility;Decreased knowledge of use of DME;Decreased safety awareness;Decreased knowledge of precautions;Pain PT Treatment Interventions: DME instruction;Gait training;Functional mobility training;Therapeutic activities;Therapeutic exercise;Balance training;Patient/family education   PT Goals Acute Rehab PT Goals PT Goal Formulation: With patient Time For Goal Achievement: 05/16/12 Potential to Achieve Goals: Good Pt will go Supine/Side to Sit: with supervision PT Goal: Supine/Side to Sit - Progress: Goal set today Pt will go Sit to Supine/Side: with supervision PT Goal: Sit to Supine/Side - Progress: Goal set today Pt will go Sit to Stand: with supervision PT Goal: Sit to Stand - Progress: Goal set today Pt will go Stand to Sit: with supervision PT Goal: Stand to Sit - Progress: Goal set today Pt will Ambulate: 51 - 150 feet;with supervision;with least restrictive assistive device PT Goal: Ambulate - Progress: Goal set today Pt will Perform Home Exercise Program: with supervision, verbal cues required/provided PT Goal: Perform Home Exercise Program - Progress: Goal set today  Visit Information  Last PT Received On: 05/05/12 Assistance  Needed: +1    Subjective Data  Subjective: This hurts a lot.  Patient Stated Goal: to go to rehab then home.    Prior Functioning  Home Living Lives With: Alone Type of Home: Apartment Home Access: Stairs to enter Entrance Stairs-Number of Steps: 3 Entrance Stairs-Rails: Right Home Layout: One level Bathroom Shower/Tub: Engineer, manufacturing systems: Standard Home Adaptive Equipment: None Prior Function Level of Independence: Independent Able to Take Stairs?: Yes Driving: Yes Vocation: On disability Communication Communication: No difficulties Dominant Hand: Right    Cognition  Overall Cognitive Status: Appears within functional limits for tasks assessed/performed Arousal/Alertness: Awake/alert Orientation Level: Appears intact for tasks assessed Behavior During Session: [facility] for tasks performed    Extremity/Trunk Assessment Right Lower Extremity Assessment RLE ROM/Strength/Tone: WFL for tasks assessed RLE Sensation: WFL - Light Touch Left Lower Extremity Assessment LLE ROM/Strength/Tone: Deficits LLE ROM/Strength/Tone Deficits: ankle motions WFL, requires assist for hip add when getting OOB.  LLE Sensation: WFL - Light Touch Trunk Assessment Trunk Assessment: Kyphotic;Other exceptions Trunk Exceptions: Pt has had several back surgeries in past.    Balance    End of Session PT - End of Session Equipment Utilized During Treatment: Gait belt Activity Tolerance: Patient limited by pain Patient left: in chair;with call bell/phone within reach Nurse Communication: Mobility status  GP     Vista Deck 05/05/2012, 10:47 AM

## 2012-05-05 NOTE — Evaluation (Signed)
Occupational Therapy Evaluation Patient Details Name: Jeremiah Goodwin MRN: [id number] DOB: 02/19/1956 Today's Date: 05/05/2012 Time: 2956-2130 OT Time Calculation (min): 23 min  OT Assessment / Plan / Recommendation Clinical Impression  57 yo male POD 1 L THA (posterior). Pt lives alone and will be discharging [facility]. Skilled OT indicated to maximize ADL independence to min A in prep for d/c to next venue of care.    OT Assessment  Patient needs continued OT Services    Follow Up Recommendations  SNF    Barriers to Discharge Decreased caregiver support    Equipment Recommendations  3 in 1 bedside comode    Recommendations for Other Services    Frequency  Min 1X/week    Precautions / Restrictions Precautions Precautions: Posterior Hip;Fall Precaution Booklet Issued: Yes (comment) Precaution Comments: Provided handout and education.  Restrictions Weight Bearing Restrictions: No Other Position/Activity Restrictions: WBAT   Pertinent Vitals/Pain Reported 7/10 pain in L hip. Repositioned and cold applied.    ADL  Grooming: Set up Where Assessed - Grooming: Unsupported sitting Upper Body Bathing: Set up Where Assessed - Upper Body Bathing: Unsupported sitting Lower Body Bathing: Moderate assistance Where Assessed - Lower Body Bathing: Supported sit to stand Upper Body Dressing: Set up Where Assessed - Upper Body Dressing: Unsupported sitting Lower Body Dressing: Maximal assistance Where Assessed - Lower Body Dressing: Supported sit to stand Toilet Transfer: +2 Total assistance Toilet Transfer: Patient Percentage: 50% Statistician Method: Sit to stand Nurse, children's) Acupuncturist: Other (comment) (to recliner) Toileting - Clothing Manipulation and Hygiene: Moderate assistance Where Assessed - Toileting Clothing Manipulation and Hygiene: Standing ADL Comments: Pt educated in all hip precautions. Introduced AE but did not demo.    OT Diagnosis:  Generalized weakness  OT Problem List: Decreased strength;Decreased safety awareness;Decreased activity tolerance;Decreased knowledge of use of DME or AE;Pain OT Treatment Interventions: Self-care/ADL training;Therapeutic activities;DME and/or AE instruction;Patient/family education   OT Goals Acute Rehab OT Goals OT Goal Formulation: With patient Time For Goal Achievement: 05/12/12 Potential to Achieve Goals: Good ADL Goals Pt Will Perform Grooming: with supervision;Standing at sink ADL Goal: Grooming - Progress: Goal set today Pt Will Perform Lower Body Bathing: with min assist;Sit to stand from chair;Sit to stand from bed;with adaptive equipment ADL Goal: Lower Body Bathing - Progress: Goal set today Pt Will Perform Lower Body Dressing: with min assist;Sit to stand from chair;Sit to stand from bed ADL Goal: Lower Body Dressing - Progress: Goal set today Pt Will Transfer to Toilet: with min assist;Ambulation;with DME;Stand pivot transfer;Maintaining hip precautions ADL Goal: Toilet Transfer - Progress: Goal set today Pt Will Perform Toileting - Clothing Manipulation: with min assist;Sitting on 3-in-1 or toilet;Standing ADL Goal: Toileting - Clothing Manipulation - Progress: Goal set today Pt Will Perform Toileting - Hygiene: with min assist;Sit to stand from 3-in-1/toilet ADL Goal: Toileting - Hygiene - Progress: Goal set today  Visit Information  Last OT Received On: 05/05/12 Assistance Needed: +2 PT/OT Co-Evaluation/Treatment: Yes    Subjective Data  Subjective: I don't have anyone to help at home. Patient Stated Goal: Go to st rehab prior to returning home.   Prior Functioning     Home Living Lives With: Alone Type of Home: Apartment Home Access: Stairs to enter Entrance Stairs-Number of Steps: 3 Entrance Stairs-Rails: Right Home Layout: One level Bathroom Shower/Tub: Engineer, manufacturing systems: Standard Home Adaptive Equipment: None Prior Function Level of  Independence: Independent Able to Take Stairs?: Yes Driving: Yes Vocation: On  disability Communication Communication: No difficulties Dominant Hand: Right         Vision/Perception     Cognition  Overall Cognitive Status: Appears within functional limits for tasks assessed/performed Arousal/Alertness: Awake/alert Orientation Level: Appears intact for tasks assessed Behavior During Session: [facility] for tasks performed    Extremity/Trunk Assessment Right Upper Extremity Assessment RUE ROM/Strength/Tone: Deficits RUE ROM/Strength/Tone Deficits: 3+-4-/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 Left Upper Extremity Assessment LUE ROM/Strength/Tone: WFL for tasks assessed Right Lower Extremity Assessment RLE ROM/Strength/Tone: WFL for tasks assessed RLE Sensation: WFL - Light Touch Left Lower Extremity Assessment LLE ROM/Strength/Tone: Deficits LLE ROM/Strength/Tone Deficits: ankle motions WFL, requires assist for hip add when getting OOB.  LLE Sensation: WFL - Light Touch Trunk Assessment Trunk Assessment: Kyphotic;Other exceptions Trunk Exceptions: Pt has had several back surgeries in past.      Mobility Bed Mobility Bed Mobility: Supine to Sit Supine to Sit: 4: Min assist;HOB elevated;With rails Details for Bed Mobility Assistance: Assist for LLE out of bed with cues for hand placement to self assist trunk.  Transfers Sit to Stand: 1: +2 Total assist;From elevated surface;With upper extremity assist;From bed Sit to Stand: Patient Percentage: 50% Stand to Sit: 1: +2 Total assist;With upper extremity assist;With armrests;To chair/3-in-1 Stand to Sit: Patient Percentage: 50% Details for Transfer Assistance: Assist to rise and steady with cues for hand placement and LE management to maintain THP.       Shoulder Instructions     Exercise     Balance     End of Session OT - End of Session Equipment Utilized During Treatment: Gait belt Activity Tolerance: Patient tolerated treatment  well Patient left: in chair;with call bell/phone within reach  GO     Cleora Karnik A OTR/L [phone] 05/05/2012, 12:56 PM

## 2012-05-05 NOTE — Progress Notes (Signed)
Physical Therapy Treatment Patient Details Name: Jeremiah Goodwin MRN: [id number] DOB: 03/01/1956 Today's Date: 05/05/2012 Time: 0865-7846 PT Time Calculation (min): 24 min  PT Assessment / Plan / Recommendation Comments on Treatment Session  Pt making slow progression and continues to have increased pain.  O2 sats at end of session were 91%, HR in 110's during session.  RN notified.  2L O2 reapplied    Follow Up Recommendations  SNF;Supervision/Assistance - 24 hour     Does the patient have the potential to tolerate intense rehabilitation     Barriers to Discharge        Equipment Recommendations  Rolling walker with 5" wheels    Recommendations for Other Services    Frequency 7X/week   Plan Discharge plan remains appropriate    Precautions / Restrictions Precautions Precautions: Posterior Hip;Fall Precaution Booklet Issued: Yes (comment) Precaution Comments: Able to state 1/3 precautions.  Re-educated on THP Restrictions Weight Bearing Restrictions: No Other Position/Activity Restrictions: WBAT   Pertinent Vitals/Pain 7/10, pt was premedicated    Mobility  Transfers Transfers: Sit to Stand;Stand to Sit Sit to Stand: 4: Min assist;3: Mod assist;With upper extremity assist;With armrests;From chair/3-in-1 Stand to Sit: 4: Min assist;With upper extremity assist;To elevated surface;To bed;3: Mod assist Details for Transfer Assistance: Assist to rise, steady and ensure controlled descent with cues for hand placement and LE management to maintain THP.  Ambulation/Gait Ambulation/Gait Assistance: 3: Mod assist;4: Min assist Ambulation Distance (Feet): 30 Feet Assistive device: Rolling walker Ambulation/Gait Assistance Details: Cues for sequencing/technique with RW, pt has tendency to step too far inside of RW.  Gait Pattern: Step-to pattern;Decreased stride length;Trunk flexed Stairs: No    Exercises Total Joint Exercises Ankle Circles/Pumps: AROM;Both;20 reps Quad Sets:  AROM;Left;10 reps Short Arc Quad: AROM;Left;10 reps Heel Slides: AAROM;Left;10 reps Hip ABduction/ADduction: AAROM;Left;10 reps   PT Diagnosis:    PT Problem List:   PT Treatment Interventions:     PT Goals Acute Rehab PT Goals PT Goal Formulation: With patient Time For Goal Achievement: 05/16/12 Potential to Achieve Goals: Good Pt will go Sit to Supine/Side: with supervision PT Goal: Sit to Supine/Side - Progress: Progressing toward goal Pt will go Sit to Stand: with supervision PT Goal: Sit to Stand - Progress: Progressing toward goal Pt will go Stand to Sit: with supervision PT Goal: Stand to Sit - Progress: Progressing toward goal Pt will Ambulate: 51 - 150 feet;with supervision;with least restrictive assistive device PT Goal: Ambulate - Progress: Progressing toward goal Pt will Perform Home Exercise Program: with supervision, verbal cues required/provided PT Goal: Perform Home Exercise Program - Progress: Progressing toward goal  Visit Information  Last PT Received On: 05/05/12 Assistance Needed: +2    Subjective Data  Subjective: I get tired so easily.  Patient Stated Goal: to go to rehab then home.    Cognition  Overall Cognitive Status: Appears within functional limits for tasks assessed/performed Arousal/Alertness: Awake/alert Orientation Level: Appears intact for tasks assessed Behavior During Session: [facility] for tasks performed    Balance     End of Session PT - End of Session Equipment Utilized During Treatment: Gait belt Activity Tolerance: Patient limited by pain Patient left: in bed;with call bell/phone within reach Nurse Communication: Mobility status   GP     Vista Deck 05/05/2012, 3:06 PM

## 2012-05-05 NOTE — Progress Notes (Addendum)
   Subjective: 1 Day Post-Op Procedure(s) (LRB): TOTAL HIP ARTHROPLASTY (Left) Patient reports pain as mild.  . Patient is recently admitted for THA We will start therapy today.  Plan is to go Rehab after hospital stay.  Objective: Vital signs in last 24 hours: Temp:  [97.3 F (36.3 C)-99.2 F (37.3 C)] 99.2 F (37.3 C) (01/11 0550) Pulse Rate:  [60-98] 98  (01/11 0550) Resp:  [14-16] 16  (01/11 0550) BP: (103-152)/(60-95) 120/83 mmHg (01/11 0550) SpO2:  [98 %-100 %] 98 % (01/11 0550) Weight:  [222 lb (100.699 kg)] 222 lb (100.699 kg) (01/10 1400)  Intake/Output from previous day:  Intake/Output Summary (Last 24 hours) at 05/05/12 0728 Last data filed at 05/05/12 0600  Gross per 24 hour  Intake 6851.67 ml  Output   5290 ml  Net 1561.67 ml    Intake/Output this shift:    Labs:  Basename 05/05/12 0450  HGB 11.7*    Basename 05/05/12 0450  WBC 6.9  RBC 3.84*  HCT 35.2*  PLT 203    Basename 05/05/12 0450  NA 136  K 4.3  CL 100  CO2 27  BUN 6  CREATININE 0.97  GLUCOSE 113*  CALCIUM 9.1   No results found for this basename: LABPT:2,INR:2 in the last 72 hours  EXAM General - Patient is Alert, Appropriate and Oriented Extremity - Neurologically intact Neurovascular intact No cellulitis present Compartment soft Dressing - dressing C/D/I Motor Function - intact, moving foot and toes well on exam.  Hemovac pulled without difficulty.  Past Medical History  Diagnosis Date  . Hyperlipidemia   . Chronic pain syndrome     Knees to feet and low back, on Neurontin.  Marland Kitchen Hypertension   . Allergy   . Osteoporosis   . Arthritis   . Depression     Assessment/Plan: 1 Day Post-Op Procedure(s) (LRB): TOTAL HIP ARTHROPLASTY (Left) Principal Problem:  *OA (osteoarthritis) of hip   Advance diet Up with therapy D/C IV fluids Discharge to SNF on 1/13  DVT Prophylaxis - Xarelto Weight Bearing As Tolerated left Leg D/C Knee Immobilizer Hemovac  Pulled Begin Therapy Hip Preacutions  Loanne Drilling

## 2012-05-06 LAB — BASIC METABOLIC PANEL
BUN: 9 mg/dL (ref 6–23)
Chloride: 98 mEq/L (ref 96–112)
GFR calc Af Amer: >90 mL/min (ref >90)
GFR calc non Af Amer: >90 mL/min (ref >90)
Glucose, Bld: 141 mg/dL — ABNORMAL HIGH (ref 70–99)
Potassium: 4.2 mEq/L (ref 3.5–5.1)
Sodium: 135 mEq/L (ref 135–145)

## 2012-05-06 LAB — CBC
HCT: 35 % — ABNORMAL LOW (ref 39.0–52.0)
Hemoglobin: 11.8 g/dL — ABNORMAL LOW (ref 13.0–17.0)
MCHC: 33.7 g/dL (ref 30.0–36.0)
RDW: 12.6 % (ref 11.5–15.5)
WBC: 14.8 10*3/uL — ABNORMAL HIGH (ref 4.0–10.5)

## 2012-05-06 MED ORDER — ACETAMINOPHEN 325 MG PO TABS: 650.0000 mg | ORAL_TABLET | Freq: Four times a day (QID) | ORAL | Status: DC | PRN

## 2012-05-06 MED ORDER — POLYETHYLENE GLYCOL 3350 17 G PO PACK: 17.0000 g | PACK | Freq: Every day | ORAL | Status: DC | PRN

## 2012-05-06 MED ORDER — METHOCARBAMOL 500 MG PO TABS: 500.0000 mg | ORAL_TABLET | Freq: Four times a day (QID) | ORAL | Status: DC | PRN

## 2012-05-06 MED ORDER — DSS 100 MG PO CAPS: 100.0000 mg | ORAL_CAPSULE | Freq: Two times a day (BID) | ORAL | Status: DC

## 2012-05-06 MED ORDER — OXYCODONE HCL 5 MG PO TABS: 5.0000 mg | ORAL_TABLET | ORAL | Status: DC | PRN

## 2012-05-06 MED ORDER — LISINOPRIL 10 MG PO TABS
10.0000 mg | ORAL_TABLET | Freq: Every day | ORAL | Status: DC
  Administered 2012-05-06 – 2012-05-07 (×2): 10 mg via ORAL
  Filled 2012-05-06 (×2): qty 1

## 2012-05-06 MED ORDER — RIVAROXABAN 10 MG PO TABS: 10.0000 mg | ORAL_TABLET | Freq: Every day | ORAL | Status: DC

## 2012-05-06 MED ORDER — BISACODYL 10 MG RE SUPP: 10.0000 mg | Freq: Every day | RECTAL | Status: DC | PRN

## 2012-05-06 MED ORDER — ONDANSETRON HCL 4 MG PO TABS: 4.0000 mg | ORAL_TABLET | Freq: Four times a day (QID) | ORAL | Status: DC | PRN

## 2012-05-06 NOTE — Progress Notes (Signed)
Physical Therapy Treatment Patient Details Name: Jeremiah Goodwin MRN: [id number] DOB: [birth year] Today's Date: 05/06/2012 Time: 4098-1191 PT Time Calculation (min): 14 min  PT Assessment / Plan / Recommendation Comments on Treatment Session  Pt motivated to progress, doing well with mobility, HR up to 132 with short walk of 20', distance limited by PT due to incr. HR.     Follow Up Recommendations  SNF;Supervision/Assistance - 24 hour     Does the patient have the potential to tolerate intense rehabilitation     Barriers to Discharge        Equipment Recommendations  Rolling walker with 5" wheels    Recommendations for Other Services    Frequency 7X/week   Plan Discharge plan remains appropriate;Frequency remains appropriate    Precautions / Restrictions Precautions Precautions: Posterior Hip;Fall Precaution Booklet Issued: Yes (comment) Precaution Comments: Able to state 2/3 precautions.  Re-educated on THP Restrictions Weight Bearing Restrictions: No Other Position/Activity Restrictions: WBAT   Pertinent Vitals/Pain **5/10 L hip Ice applied*    Mobility   Transfers Transfers: Sit to Stand;Stand to Sit Sit to Stand: From elevated surface;With upper extremity assist;From chair/3-in-1;4: Min guard Sit to Stand: Patient Percentage: 70% Stand to Sit: With upper extremity assist;With armrests;To chair/3-in-1;4: Min guard Stand to Sit: Patient Percentage: 80% Details for Transfer Assistance: VCs hand placement Ambulation/Gait Ambulation/Gait Assistance: 4: Min guard Ambulation Distance (Feet): 20 Feet Assistive device: Rolling walker Gait Pattern: Step-to pattern General Gait Details: distance limited by PT 2* elevated HR of 132 with walking, SaO2 89-90% on RA Stairs: No Wheelchair Mobility Wheelchair Mobility: No    Exercises Total Joint Exercises Ankle Circles/Pumps: AROM;Both;20 reps Quad Sets: AROM;Left;10 reps Short Arc Quad: AROM;Left;15 reps Heel Slides:  AAROM;Left;10 reps Hip ABduction/ADduction: AAROM;Left;10 reps Long Arc Quad: AROM;Left;15 reps;Seated   PT Diagnosis:    PT Problem List:   PT Treatment Interventions:     PT Goals Acute Rehab PT Goals PT Goal Formulation: With patient Time For Goal Achievement: 05/16/12 Potential to Achieve Goals: Good Pt will go Supine/Side to Sit: with supervision PT Goal: Supine/Side to Sit - Progress: Progressing toward goal Pt will go Sit to Supine/Side: with supervision Pt will go Sit to Stand: with supervision PT Goal: Sit to Stand - Progress: Met Pt will go Stand to Sit: with supervision PT Goal: Stand to Sit - Progress: Met Pt will Ambulate: 51 - 150 feet;with supervision;with least restrictive assistive device PT Goal: Ambulate - Progress: Progressing toward goal Pt will Perform Home Exercise Program: with supervision, verbal cues required/provided PT Goal: Perform Home Exercise Program - Progress: Progressing toward goal  Visit Information  Last PT Received On: 05/06/12 Assistance Needed: +1    Subjective Data  Subjective: Why is my heart rate going up like that? Patient Stated Goal: rehab then home   Cognition  Overall Cognitive Status: Appears within functional limits for tasks assessed/performed Arousal/Alertness: Awake/alert Orientation Level: Appears intact for tasks assessed Behavior During Session: [facility] for tasks performed    Balance     End of Session PT - End of Session Activity Tolerance: Treatment limited secondary to medical complications (Comment) (HR up to 132 with walking) Patient left: with call bell/phone within reach;in chair Nurse Communication: Mobility status   GP     Ralene Bathe Kistler 05/06/2012, 1:13 PM [phone]

## 2012-05-06 NOTE — Progress Notes (Signed)
CSW met with the Pt and sister at the bedside to provide bed offers and discuss d/c planning.  Pt and sister would prefer [facility], however this facility has not responded to the [facility] fax.   Pt would like to wait to see if this facility responds to request and if not to see if the facility could be contacted to request a response.   Weekday CSW to follow up with d/c planning.   Leron Croak, LCSWA [employer] [phone]

## 2012-05-06 NOTE — Discharge Summary (Signed)
Physician Discharge Summary   Patient ID: Jeremiah Goodwin MRN: [id number] DOB/AGE: 1955/05/09 57 y.o.  Admit date: 05/04/2012 Discharge date: 05/07/2012  Primary Diagnosis: Osteoarthritis Left hip   Admission Diagnoses:  Past Medical History  Diagnosis Date  . Hyperlipidemia   . Chronic pain syndrome     Knees to feet and low back, on Neurontin.  Marland Kitchen Hypertension   . Allergy   . Osteoporosis   . Arthritis   . Depression    Discharge Diagnoses:   Principal Problem:  *OA (osteoarthritis) of hip  Estimated Body mass index is 34.77 kg/(m^2) as calculated from the following:   Height as of this encounter: 5\' 7" (1.702 m).   Weight as of this encounter: 222 lb(100.699 kg).  Classification of overweight in adults according to BMI (WHO, 1998)   Procedure: Procedure(s) (LRB): TOTAL HIP ARTHROPLASTY (Left)   Consults: None  HPI: Jeremiah Goodwin is a 57 y.o. male with end stage arthritis of his left hip with progressively worsening pain and dysfunction. Pain occurs with activity and rest including pain at night. He has tried analgesics, protected weight bearing and rest without benefit. Pain is too severe to attempt physical therapy. Radiographs demonstrate bone on bone arthritis with subchondral cyst formation. He presents now for left THA.  Laboratory Data: Admission on 05/04/2012  Component Date Value Range Status  . ABO/RH(D) 05/04/2012 O POS   Final  . Antibody Screen 05/04/2012 NEG   Final  . Sample Expiration 05/04/2012 05/07/2012   Final  . WBC 05/05/2012 6.9  4.0 - 10.5 K/uL Final  . RBC 05/05/2012 3.84* 4.22 - 5.81 MIL/uL Final  . Hemoglobin 05/05/2012 11.7* 13.0 - 17.0 g/dL Final  . HCT 78/29/5621 35.2* 39.0 - 52.0 % Final  . MCV 05/05/2012 91.7  78.0 - 100.0 fL Final  . MCH 05/05/2012 30.5  26.0 - 34.0 pg Final  . MCHC 05/05/2012 33.2  30.0 - 36.0 g/dL Final  . RDW 30/86/5784 12.8  11.5 - 15.5 % Final  . Platelets 05/05/2012 203  150 - 400 K/uL Final  . Sodium  05/05/2012 136  135 - 145 mEq/L Final  . Potassium 05/05/2012 4.3  3.5 - 5.1 mEq/L Final  . Chloride 05/05/2012 100  96 - 112 mEq/L Final  . CO2 05/05/2012 27  19 - 32 mEq/L Final  . Glucose, Bld 05/05/2012 113* 70 - 99 mg/dL Final  . BUN 69/62/9528 6  6 - 23 mg/dL Final  . Creatinine, Ser 05/05/2012 0.97  0.50 - 1.35 mg/dL Final  . Calcium 41/32/4401 9.1  8.4 - 10.5 mg/dL Final  . GFR calc non Af Amer 05/05/2012 >90  >90 mL/min Final  . GFR calc Af Amer 05/05/2012 >90  >90 mL/min Final   Comment:                                 The eGFR has been calculated                          using the CKD EPI equation.                          This calculation has not been                          validated in all clinical  situations.                          eGFR's persistently                          <90 mL/min signify                          possible Chronic Kidney Disease.  . WBC 05/06/2012 14.8* 4.0 - 10.5 K/uL Final  . RBC 05/06/2012 3.84* 4.22 - 5.81 MIL/uL Final  . Hemoglobin 05/06/2012 11.8* 13.0 - 17.0 g/dL Final  . HCT 16/01/9603 35.0* 39.0 - 52.0 % Final  . MCV 05/06/2012 91.1  78.0 - 100.0 fL Final  . MCH 05/06/2012 30.7  26.0 - 34.0 pg Final  . MCHC 05/06/2012 33.7  30.0 - 36.0 g/dL Final  . RDW 54/12/8117 12.6  11.5 - 15.5 % Final  . Platelets 05/06/2012 227  150 - 400 K/uL Final  . Sodium 05/06/2012 135  135 - 145 mEq/L Final  . Potassium 05/06/2012 4.2  3.5 - 5.1 mEq/L Final  . Chloride 05/06/2012 98  96 - 112 mEq/L Final  . CO2 05/06/2012 29  19 - 32 mEq/L Final  . Glucose, Bld 05/06/2012 141* 70 - 99 mg/dL Final  . BUN 14/78/2956 9  6 - 23 mg/dL Final  . Creatinine, Ser 05/06/2012 0.92  0.50 - 1.35 mg/dL Final  . Calcium 21/30/8657 9.7  8.4 - 10.5 mg/dL Final  . GFR calc non Af Amer 05/06/2012 >90  >90 mL/min Final  . GFR calc Af Amer 05/06/2012 >90  >90 mL/min Final   Comment:                                 The eGFR has been calculated                           using the CKD EPI equation.                          This calculation has not been                          validated in all clinical                          situations.                          eGFR's persistently                          <90 mL/min signify                          possible Chronic Kidney Disease.  Hospital Outpatient Visit on 04/26/2012  Component Date Value Range Status  . MRSA, PCR 04/26/2012 NEGATIVE  NEGATIVE Final  . Staphylococcus aureus 04/26/2012 NEGATIVE  NEGATIVE Final   Comment:                                 The  Xpert SA Assay (FDA                          approved for NASAL specimens                          in patients over 13 years of age),                          is one component of                          a comprehensive surveillance                          program.  Test performance has                          been validated by [employer] for patients greater                          than or equal to 67 year old.                          It is not intended                          to diagnose infection nor to                          guide or monitor treatment.  Marland Kitchen aPTT 04/26/2012 34  24 - 37 seconds Final  . WBC 04/26/2012 5.6  4.0 - 10.5 K/uL Final  . RBC 04/26/2012 4.72  4.22 - 5.81 MIL/uL Final  . Hemoglobin 04/26/2012 14.6  13.0 - 17.0 g/dL Final  . HCT 11/91/4782 41.7  39.0 - 52.0 % Final  . MCV 04/26/2012 88.3  78.0 - 100.0 fL Final  . MCH 04/26/2012 30.9  26.0 - 34.0 pg Final  . MCHC 04/26/2012 35.0  30.0 - 36.0 g/dL Final  . RDW 95/62/1308 12.6  11.5 - 15.5 % Final  . Platelets 04/26/2012 229  150 - 400 K/uL Final  . Sodium 04/26/2012 136  135 - 145 mEq/L Final  . Potassium 04/26/2012 3.7  3.5 - 5.1 mEq/L Final  . Chloride 04/26/2012 102  96 - 112 mEq/L Final  . CO2 04/26/2012 24  19 - 32 mEq/L Final  . Glucose, Bld 04/26/2012 95  70 - 99 mg/dL Final  . BUN 65/78/4696 12  6 - 23  mg/dL Final  . Creatinine, Ser 04/26/2012 1.00  0.50 - 1.35 mg/dL Final  . Calcium 29/52/8413 9.8  8.4 - 10.5 mg/dL Final  . Total Protein 04/26/2012 7.5  6.0 - 8.3 g/dL Final  . Albumin 24/40/1027 3.9  3.5 - 5.2 g/dL Final  . AST 25/36/6440 25  0 - 37 U/L Final  . ALT 04/26/2012 37  0 - 53 U/L Final  . Alkaline Phosphatase 04/26/2012 85  39 - 117 U/L Final  . Total Bilirubin 04/26/2012 0.2* 0.3 - 1.2 mg/dL Final  .  GFR calc non Af Amer 04/26/2012 82* >90 mL/min Final  . GFR calc Af Amer 04/26/2012 >90  >90 mL/min Final   Comment:                                 The eGFR has been calculated                          using the CKD EPI equation.                          This calculation has not been                          validated in all clinical                          situations.                          eGFR's persistently                          <90 mL/min signify                          possible Chronic Kidney Disease.  Marland Kitchen Prothrombin Time 04/26/2012 11.9  11.6 - 15.2 seconds Final  . INR 04/26/2012 0.88  0.00 - 1.49 Final  . Color, Urine 04/26/2012 YELLOW  YELLOW Final  . APPearance 04/26/2012 CLEAR  CLEAR Final  . Specific Gravity, Urine 04/26/2012 1.004* 1.005 - 1.030 Final  . pH 04/26/2012 6.5  5.0 - 8.0 Final  . Glucose, UA 04/26/2012 NEGATIVE  NEGATIVE mg/dL Final  . Hgb urine dipstick 04/26/2012 NEGATIVE  NEGATIVE Final  . Bilirubin Urine 04/26/2012 NEGATIVE  NEGATIVE Final  . Ketones, ur 04/26/2012 NEGATIVE  NEGATIVE mg/dL Final  . Protein, ur 16/01/9603 NEGATIVE  NEGATIVE mg/dL Final  . Urobilinogen, UA 04/26/2012 0.2  0.0 - 1.0 mg/dL Final  . Nitrite 54/12/8117 NEGATIVE  NEGATIVE Final  . Leukocytes, UA 04/26/2012 NEGATIVE  NEGATIVE Final   MICROSCOPIC NOT DONE ON URINES WITH NEGATIVE PROTEIN, BLOOD, LEUKOCYTES, NITRITE, OR GLUCOSE <1000 mg/dL.     X-Rays:Dg Hip Complete Left  04/26/2012  *RADIOLOGY REPORT*  Clinical Data: Preop left total hip replacement  LEFT  HIP - COMPLETE 2+ VIEW  Comparison: 10/08/2011  Findings: Moderate degenerative changes with joint space narrowing osteophytic spurring in the left hip.  Of note, this is rapidly progressive from the prior study.  No definite cortical destructive changes.  No fracture or dislocation is seen.  Visualized bony pelvis appears intact.  Right hip joint space is preserved.  Degenerative changes of the lower lumbar spine with prior lower lumbosacral fusion.  IMPRESSION: Moderate degenerative changes of the left hip.  These findings are rapidly progressive from 10/08/2011. Although no definite cortical destruction is noted, an infectious/inflammatory arthropathy is not excluded.   Original Report Authenticated By: Charline Bills, M.D.    Dg Pelvis Portable  05/04/2012  *RADIOLOGY REPORT*  Clinical Data: Postop left hip arthroplasty.  PORTABLE PELVIS AP VIEW  Comparison: Left hip x-ray obtained concurrently.  AP pelvis x-ray 04/26/2012.  Findings: Left hip arthroplasty with anatomic alignment and no complicating features.  Surgical drain in place.  Contralateral right hip joint intact with well-preserved joint space.  Sacroiliac joints and symphysis pubis intact.  Prior lower lumbar fusion.  IMPRESSION: Left hip arthroplasty with anatomic alignment and no complicating features.   Original Report Authenticated By: Hulan Saas, M.D.    Dg Hip Portable 1 View Left  05/04/2012  *RADIOLOGY REPORT*  Clinical Data: Postop left hip arthroplasty.  PORTABLE LEFT HIP - 1 VIEW  Comparison: Portable AP pelvis obtained concurrently.  Left hip x- rays 04/26/2012.  Findings: Left hip arthroplasty with anatomic alignment and no complicating features.  Surgical drain in place.  IMPRESSION: Left hip arthroplasty with anatomic alignment and no complicating features.   Original Report Authenticated By: Hulan Saas, M.D.     EKG: Orders placed during the hospital encounter of 05/04/12  . EKG 12-LEAD  . EKG 12-LEAD      Hospital Course: Patient was admitted to [facility] and taken to the OR and underwent the above state procedure without complications.  Patient tolerated the procedure well and was later transferred to the recovery room and then to the orthopaedic floor for postoperative care.  They were given PO and IV analgesics for pain control following their surgery.  They were given 24 hours of postoperative antibiotics of  Anti-infectives     Start     Dose/Rate Route Frequency Ordered Stop   05/04/12 1400   ceFAZolin (ANCEF) IVPB 2 g/50 mL premix        2 g 100 mL/hr over 30 Minutes Intravenous Every 6 hours 05/04/12 1014 05/04/12 2011   05/04/12 0511   ceFAZolin (ANCEF) IVPB 2 g/50 mL premix        2 g 100 mL/hr over 30 Minutes Intravenous 60 min pre-op 05/04/12 0511 05/04/12 0724         and started on DVT prophylaxis in the form of Xarelto.   PT and OT were ordered for total hip protocol.  The patient was allowed to be WBAT with therapy. Discharge planning was consulted to help with postop disposition and equipment needs.  Patient had a good night on the evening of surgery and started to get up OOB with therapy on day one.  Hemovac drain was pulled without difficulty.  The knee immobilizer was removed and discontinued.  Continued to work with therapy into day two.  Dressing was changed on day two and the incision was healing well.  By day three, the patient had progressed with therapy and meeting their goals.  Incision was healing well.  Patient was seen in rounds and was ready to go to SNF.   Discharge Medications: Prior to Admission medications   Medication Sig Start Date End Date Taking? Authorizing Provider  gabapentin (NEURONTIN) 300 MG capsule Take 300-600 mg by mouth 3 (three) times daily. Takes 2 in the morning 1 at lunch and 2 at night   Yes Historical Provider, MD  lisinopril (PRINIVIL,ZESTRIL) 10 MG tablet Take 1 tablet (10 mg total) by mouth at bedtime. 09/10/11  Yes Jonita Albee, MD  simvastatin (ZOCOR) 20 MG tablet Take 1 tablet (20 mg total) by mouth every evening. 09/10/11  Yes Jonita Albee, MD  acetaminophen (TYLENOL) 325 MG tablet Take 2 tablets (650 mg total) by mouth every 6 (six) hours as needed (or Fever >/= 101). 05/06/12   Fransisco Messmer Julien Girt, PA  amitriptyline (ELAVIL) 25 MG tablet Take 1 tablet (25 mg total) by mouth at bedtime. 03/16/12   Jonita Albee,  MD  bisacodyl (DULCOLAX) 10 MG suppository Place 1 suppository (10 mg total) rectally daily as needed. 05/06/12   Analucia Hush, PA  docusate sodium 100 MG CAPS Take 100 mg by mouth 2 (two) times daily. 05/06/12   Trevion Hoben Julien Girt, PA  methocarbamol (ROBAXIN) 500 MG tablet Take 1 tablet (500 mg total) by mouth every 6 (six) hours as needed. 05/06/12   Laiyah Exline, PA  ondansetron (ZOFRAN) 4 MG tablet Take 1 tablet (4 mg total) by mouth every 6 (six) hours as needed for nausea. 05/06/12   Jazelle Achey, PA  oxyCODONE (OXY IR/ROXICODONE) 5 MG immediate release tablet Take 1-4 tablets (5-20 mg total) by mouth every 3 (three) hours as needed. 05/06/12   Saga Balthazar, PA  polyethylene glycol (MIRALAX / GLYCOLAX) packet Take 17 g by mouth daily as needed. 05/06/12   Rosco Harriott Julien Girt, PA  rivaroxaban (XARELTO) 10 MG TABS tablet Take 1 tablet (10 mg total) by mouth daily with breakfast. Take Xarelto for two and a half more weeks, then discontinue Xarelto. 05/06/12   Taliah Porche Julien Girt, PA    Diet: Cardiac diet Activity:WBAT No bending hip over 90 degrees- A "L" Angle Do not cross legs Do not let foot roll inward When turning these patients a pillow should be placed between the patient's legs to prevent crossing. Patients should have the affected knee fully extended when trying to sit or stand from all surfaces to prevent excessive hip flexion. When ambulating and turning toward the affected side the affected leg should have the toes turned out prior to moving the walker and the rest  of patient's body as to prevent internal rotation/ turning in of the leg. Abduction pillows are the most effective way to prevent a patient from not crossing legs or turning toes in at rest. If an abduction pillow is not ordered placing a regular pillow length wise between the patient's legs is also an effective reminder. It is imperative that these precautions be maintained so that the surgical hip does not dislocate. Follow-up:in 2 weeks Disposition - Skilled nursing facility Discharged Condition: good   Discharge Orders    Future Appointments: Provider: Department: Dept Phone: Center:   07/16/2012 8:30 AM Jonita Albee, MD [facility] [phone] [facility]     Future Orders Please Complete By Expires   Diet general      Call MD / Call 911      Comments:   If you experience chest pain or shortness of breath, CALL 911 and be transported to the hospital emergency room.  If you develope a fever above 101 F, pus (white drainage) or increased drainage or redness at the wound, or calf pain, call your surgeon's office.   Discharge instructions      Comments:   Pick up stool softner and laxative for home. Do not submerge incision under water. May shower. Continue to use ice for pain and swelling from surgery. Hip precautions.  Total Hip Protocol.  Take Xarelto for two and a half more weeks, then discontinue Xarelto.   Constipation Prevention      Comments:   Drink plenty of fluids.  Prune juice may be helpful.  You may use a stool softener, such as Colace (over the counter) 100 mg twice a day.  Use MiraLax (over the counter) for constipation as needed.   Increase activity slowly as tolerated      Patient may shower      Comments:   You may shower without a dressing  once there is no drainage.  Do not wash over the wound.  If drainage remains, do not shower until drainage stops.   Weight bearing as tolerated      Driving restrictions      Comments:   No driving until released  by the physician.   Lifting restrictions      Comments:   No lifting until released by the physician.   Follow the hip precautions as taught in Physical Therapy      Change dressing      Comments:   You may change your dressing dressing daily with sterile 4 x 4 inch gauze dressing and paper tape.  Do not submerge the incision under water.   TED hose      Comments:   Use stockings (TED hose) for 3 weeks on both leg(s).  You may remove them at night for sleeping.   Do not sit on low chairs, stoools or toilet seats, as it may be difficult to get up from low surfaces          Medication List     As of 05/06/2012  9:35 PM    STOP taking these medications         omega-3 acid ethyl esters 1 G capsule   Commonly known as: LOVAZA      oxycodone 5 MG capsule   Commonly known as: OXY-IR      risedronate 35 MG tablet   Commonly known as: ACTONEL      TAKE these medications         acetaminophen 325 MG tablet   Commonly known as: TYLENOL   Take 2 tablets (650 mg total) by mouth every 6 (six) hours as needed (or Fever >/= 101).      amitriptyline 25 MG tablet   Commonly known as: ELAVIL   Take 1 tablet (25 mg total) by mouth at bedtime.      bisacodyl 10 MG suppository   Commonly known as: DULCOLAX   Place 1 suppository (10 mg total) rectally daily as needed.      DSS 100 MG Caps   Take 100 mg by mouth 2 (two) times daily.      gabapentin 300 MG capsule   Commonly known as: NEURONTIN   Take 300-600 mg by mouth 3 (three) times daily. Takes 2 in the morning 1 at lunch and 2 at night      lisinopril 10 MG tablet   Commonly known as: PRINIVIL,ZESTRIL   Take 1 tablet (10 mg total) by mouth at bedtime.      methocarbamol 500 MG tablet   Commonly known as: ROBAXIN   Take 1 tablet (500 mg total) by mouth every 6 (six) hours as needed.      ondansetron 4 MG tablet   Commonly known as: ZOFRAN   Take 1 tablet (4 mg total) by mouth every 6 (six) hours as needed for nausea.       oxyCODONE 5 MG immediate release tablet   Commonly known as: Oxy IR/ROXICODONE   Take 1-4 tablets (5-20 mg total) by mouth every 3 (three) hours as needed.      polyethylene glycol packet   Commonly known as: MIRALAX / GLYCOLAX   Take 17 g by mouth daily as needed.      rivaroxaban 10 MG Tabs tablet   Commonly known as: XARELTO   Take 1 tablet (10 mg total) by mouth daily with breakfast. Take Xarelto for two and a  half more weeks, then discontinue Xarelto.      simvastatin 20 MG tablet   Commonly known as: ZOCOR   Take 1 tablet (20 mg total) by mouth every evening.           Follow-up Information    Follow up with Loanne Drilling, MD. Schedule an appointment as soon as possible for a visit in 2 weeks.   Contact information:   [address] [place] [address] [phone]          Signed: ZYLER, HYSON 05/06/2012, 9:35 PM

## 2012-05-06 NOTE — Progress Notes (Signed)
Subjective: 2 Days Post-Op Procedure(s) (LRB): TOTAL HIP ARTHROPLASTY (Left) Patient reports pain as 3 on 0-10 scale. Doing Well today. Dressing changed and wound looks fine.WBC 14.8 and Temp 99.4. Hematoma in wound,but no drainage.   Objective: Vital signs in last 24 hours: Temp:  [98.5 F (36.9 C)-100.3 F (37.9 C)] 99.4 F (37.4 C) (01/12 0641) Pulse Rate:  [100-123] 109  (01/12 0641) Resp:  [16] 16  (01/11 2209) BP: (126-148)/(83-89) 148/88 mmHg (01/12 0641) SpO2:  [93 %-97 %] 96 % (01/11 2209)  Intake/Output from previous day: 01/11 0701 - 01/12 0700 In: 1520 [P.O.:1020; I.V.:400] Out: 3800 [Urine:3800] Intake/Output this shift:     Basename 05/06/12 0449 05/05/12 0450  HGB 11.8* 11.7*    Basename 05/06/12 0449 05/05/12 0450  WBC 14.8* 6.9  RBC 3.84* 3.84*  HCT 35.0* 35.2*  PLT 227 203    Basename 05/06/12 0449 05/05/12 0450  NA 135 136  K 4.2 4.3  CL 98 100  CO2 29 27  BUN 9 6  CREATININE 0.92 0.97  GLUCOSE 141* 113*  CALCIUM 9.7 9.1   No results found for this basename: LABPT:2,INR:2 in the last 72 hours  No cellulitis present  Assessment/Plan: 2 Days Post-Op Procedure(s) (LRB): TOTAL HIP ARTHROPLASTY (Left) Up with therapy  Bassy Fetterly A 05/06/2012, 8:28 AM

## 2012-05-06 NOTE — Progress Notes (Signed)
Physical Therapy Treatment Patient Details Name: Jeremiah Goodwin MRN: [id number] DOB: 1955/07/05 Today's Date: 05/06/2012 Time: 4742-5956 PT Time Calculation (min): 25 min  PT Assessment / Plan / Recommendation Comments on Treatment Session  Increased gait distance, but distance limited by PT due to pt's HR up to 140, RN notified. SaO2 90% with walking.     Follow Up Recommendations  SNF;Supervision/Assistance - 24 hour     Does the patient have the potential to tolerate intense rehabilitation     Barriers to Discharge        Equipment Recommendations  Rolling walker with 5" wheels    Recommendations for Other Services    Frequency 7X/week   Plan Discharge plan remains appropriate;Frequency remains appropriate    Precautions / Restrictions Precautions Precautions: Posterior Hip;Fall Precaution Booklet Issued: Yes (comment) Precaution Comments: Able to state 2/3 precautions.  Re-educated on THP Restrictions Weight Bearing Restrictions: No Other Position/Activity Restrictions: WBAT   Pertinent Vitals/Pain *6/10 L hip Premedicated, ice applied**    Mobility  Bed Mobility Bed Mobility: Supine to Sit Supine to Sit: 4: Min assist;HOB elevated;With rails Details for Bed Mobility Assistance: Assist for LLE out of bed with cues for hand placement to self assist trunk.  Transfers Transfers: Sit to Stand;Stand to Sit Sit to Stand: From elevated surface;With upper extremity assist;From bed;3: Mod assist Sit to Stand: Patient Percentage: 70% Stand to Sit: With upper extremity assist;With armrests;To chair/3-in-1;4: Min assist Stand to Sit: Patient Percentage: 80% Details for Transfer Assistance: Assist to rise and steady with cues for hand placement and LE management to maintain THP.   Ambulation/Gait Ambulation/Gait Assistance: 4: Min guard Ambulation Distance (Feet): 70 Feet Assistive device: Rolling walker Gait Pattern: Step-to pattern Stairs: No Wheelchair  Mobility Wheelchair Mobility: No    Exercises Total Joint Exercises Ankle Circles/Pumps: AROM;Both;20 reps Quad Sets: AROM;Left;10 reps Short Arc Quad: AROM;Left;10 reps Heel Slides: AAROM;Left;10 reps Hip ABduction/ADduction: AAROM;Left;10 reps   PT Diagnosis:    PT Problem List:   PT Treatment Interventions:     PT Goals Acute Rehab PT Goals PT Goal Formulation: With patient Time For Goal Achievement: 05/16/12 Potential to Achieve Goals: Good Pt will go Supine/Side to Sit: with supervision PT Goal: Supine/Side to Sit - Progress: Progressing toward goal Pt will go Sit to Supine/Side: with supervision Pt will go Sit to Stand: with supervision PT Goal: Sit to Stand - Progress: Progressing toward goal Pt will go Stand to Sit: with supervision PT Goal: Stand to Sit - Progress: Progressing toward goal Pt will Ambulate: 51 - 150 feet;with supervision;with least restrictive assistive device PT Goal: Ambulate - Progress: Progressing toward goal Pt will Perform Home Exercise Program: with supervision, verbal cues required/provided PT Goal: Perform Home Exercise Program - Progress: Progressing toward goal  Visit Information  Last PT Received On: 05/06/12 Assistance Needed: +1    Subjective Data  Subjective: Is my oxygen level low? Patient Stated Goal: to get stronger   Cognition  Overall Cognitive Status: Appears within functional limits for tasks assessed/performed Arousal/Alertness: Awake/alert Orientation Level: Appears intact for tasks assessed Behavior During Session: [facility] for tasks performed    Balance     End of Session PT - End of Session Activity Tolerance: Treatment limited secondary to medical complications (Comment) (HR up to 140 with walking) Patient left: with call bell/phone within reach;in chair Nurse Communication: Mobility status   GP     Ralene Bathe Kistler 05/06/2012, 9:52 AM [phone]

## 2012-05-06 NOTE — Plan of Care (Signed)
Problem: Phase III Progression Outcomes Goal: Anticoagulant follow-up in place Outcome: Not Applicable Date Met:  05/06/12 xarelto

## 2012-05-07 LAB — CBC
HCT: 31.3 % — ABNORMAL LOW (ref 39.0–52.0)
MCHC: 33.9 g/dL (ref 30.0–36.0)
MCV: 91 fL (ref 78.0–100.0)
Platelets: 223 10*3/uL (ref 150–400)
RDW: 12.8 % (ref 11.5–15.5)

## 2012-05-07 NOTE — Progress Notes (Signed)
Clinical Social Work Department CLINICAL SOCIAL WORK PLACEMENT NOTE 05/07/2012  Patient:  Jeremiah Goodwin, Jeremiah Goodwin  Account Number:  [id number] Admit date:  05/04/2012  Clinical Social Worker:  Cori Razor, LCSW  Date/time:  05/04/2012 03:05 PM  Clinical Social Work is seeking post-discharge placement for this patient at the following level of care:   SKILLED NURSING   (*CSW will update this form in Epic as items are completed)   05/04/2012  Patient/family provided with [facility] Department of Clinical Social Work's list of facilities offering this level of care within the geographic area requested by the patient (or if unable, by the patient's family).  05/04/2012  Patient/family informed of their freedom to choose among providers that offer the needed level of care, that participate in Medicare, Medicaid or managed care program needed by the patient, have an available bed and are willing to accept the patient.    Patient/family informed of [facility]' ownership interest in [facility], as well as of the fact that they are under no obligation to receive care at this facility.  PASARR submitted to EDS on 05/04/2012 PASARR number received from EDS on   FL2 transmitted to all facilities in geographic area requested by pt/family on  05/04/2012 FL2 transmitted to all facilities within larger geographic area on   Patient informed that his/her managed care company has contracts with or will negotiate with  certain facilities, including the following:     Patient/family informed of bed offers received:  05/12/2012 Patient chooses bed at [facility] Physician recommends and patient chooses bed at    Patient to be transferred to [facility] on  05/07/2012 Patient to be transferred to facility by [facility]  The following physician request were entered in Epic:   Additional Comments:  Cori Razor LCSW [phone]

## 2012-05-07 NOTE — Progress Notes (Signed)
CARE MANAGEMENT NOTE 05/07/2012  Patient:  Jeremiah, Goodwin   Account Number:  [id number]  Date Initiated:  05/07/2012  Documentation initiated by:  Colleen Can  Subjective/Objective Assessment:   dx  Osteoarthritis left hip: Total hip replacemnt on day of admission     Action/Plan:   CM spoke with patient and sister. Plans are for patient to go to SNF for rehab.   Anticipated DC Date:  05/07/2012   Anticipated DC Plan:  SKILLED NURSING FACILITY  In-house referral  Clinical Social Worker      DC Planning Services  CM consult          Status of service:  Completed, signed off Medicare Important Message given?  NA - LOS <3 / Initial given by admissions (If response is "NO", the following Medicare IM given date fields will be blank) Date Medicare IM given:   Date Additional Medicare IM given:    Discharge Disposition:  SKILLED NURSING FACILITY

## 2012-05-07 NOTE — Progress Notes (Signed)
Physical Therapy Treatment Patient Details Name: Jeremiah Goodwin MRN: [id number] DOB: 11-06-1955 Today's Date: 05/07/2012 Time: 9629-5284 PT Time Calculation (min): 25 min  PT Assessment / Plan / Recommendation Comments on Treatment Session  Doing well with mobility, min A supine to sit, supervision to walk 100' with RW, pt reported some dizziness while walking; VSS. Ready to DC to SNF from PT standpoint.    Follow Up Recommendations  SNF;Supervision/Assistance - 24 hour     Does the patient have the potential to tolerate intense rehabilitation     Barriers to Discharge        Equipment Recommendations  Rolling walker with 5" wheels    Recommendations for Other Services    Frequency 7X/week   Plan Discharge plan remains appropriate;Frequency remains appropriate    Precautions / Restrictions Precautions Precautions: Posterior Hip;Fall Precaution Booklet Issued: Yes (comment) Precaution Comments: Able to state 2/3 precautions.  Re-educated on THP Restrictions Weight Bearing Restrictions: No Other Position/Activity Restrictions: WBAT   Pertinent Vitals/Pain **7/10 L hip with walking Ice applied*    Mobility  Bed Mobility Bed Mobility: Supine to Sit Supine to Sit: 4: Min assist Details for Bed Mobility Assistance: min A LLE Transfers Transfers: Sit to Stand;Stand to Sit Sit to Stand: From elevated surface;With upper extremity assist;From chair/3-in-1;5: Supervision;From bed Stand to Sit: With upper extremity assist;With armrests;To chair/3-in-1;To bed;5: Supervision Details for Transfer Assistance: VCs hand placement Ambulation/Gait Ambulation/Gait Assistance: 5: Supervision Ambulation Distance (Feet): 100 Feet (50' x 2 with seated rest break 2* dizziness; VSS) Assistive device: Rolling walker Gait Pattern: Step-to pattern General Gait Details: improved HR with walking today, HR up to 119 Stairs: No Wheelchair Mobility Wheelchair Mobility: No    Exercises Total Joint  Exercises Ankle Circles/Pumps: AROM;Both;20 reps Quad Sets: AROM;Left;10 reps Short Arc Quad: AROM;Left;15 reps;Supine Heel Slides: AAROM;Left;15 reps Hip ABduction/ADduction: AAROM;Left;15 reps;Supine Long Arc Quad: AROM;Left;15 reps;Seated   PT Diagnosis:    PT Problem List:   PT Treatment Interventions:     PT Goals Acute Rehab PT Goals PT Goal Formulation: With patient Time For Goal Achievement: 05/16/12 Potential to Achieve Goals: Good Pt will go Supine/Side to Sit: with supervision PT Goal: Supine/Side to Sit - Progress: Progressing toward goal Pt will go Sit to Supine/Side: with supervision PT Goal: Sit to Supine/Side - Progress: Progressing toward goal Pt will go Sit to Stand: with supervision PT Goal: Sit to Stand - Progress: Met Pt will go Stand to Sit: with supervision PT Goal: Stand to Sit - Progress: Met Pt will Ambulate: 51 - 150 feet;with supervision;with least restrictive assistive device PT Goal: Ambulate - Progress: Met Pt will Perform Home Exercise Program: with supervision, verbal cues required/provided PT Goal: Perform Home Exercise Program - Progress: Progressing toward goal  Visit Information  Last PT Received On: 05/07/12 Assistance Needed: +1    Subjective Data  Subjective: I'm tired today, and my arms are sore from using the walker.  Patient Stated Goal: DC to SNf   Cognition  Overall Cognitive Status: Appears within functional limits for tasks assessed/performed Arousal/Alertness: Awake/alert Orientation Level: Appears intact for tasks assessed Behavior During Session: [facility] for tasks performed    Balance     End of Session PT - End of Session Activity Tolerance: Patient tolerated treatment well (HR up to 132 with walking) Patient left: with call bell/phone within reach;in bed;with family/visitor present Nurse Communication: Mobility status   GP     Ralene Bathe Kistler 05/07/2012, 12:14 PM [phone]

## 2012-05-07 NOTE — Progress Notes (Signed)
   Subjective: 3 Days Post-Op Procedure(s) (LRB): TOTAL HIP ARTHROPLASTY (Left) Patient reports pain as mild.   Patient seen in rounds with Dr. Lequita Halt. Patient is well, and has had no acute complaints or problems Patient is ready to go SNF later today  Objective: Vital signs in last 24 hours: Temp:  [98.5 F (36.9 C)-99.5 F (37.5 C)] 98.7 F (37.1 C) (01/13 0520) Pulse Rate:  [104-140] 104  (01/13 0520) Resp:  [16-20] 20  (01/13 0520) BP: (122-160)/(84-101) 130/86 mmHg (01/13 0520) SpO2:  [90 %-94 %] 92 % (01/13 0520)  Intake/Output from previous day:  Intake/Output Summary (Last 24 hours) at 05/07/12 0626 Last data filed at 05/07/12 0520  Gross per 24 hour  Intake   1080 ml  Output   2350 ml  Net  -1270 ml    Intake/Output this shift: Total I/O In: 360 [P.O.:360] Out: 950 [Urine:950]  Labs:  [facility] 05/07/12 0444 05/06/12 0449 05/05/12 0450  HGB 10.6* 11.8* 11.7*    Basename 05/07/12 0444 05/06/12 0449  WBC 12.8* 14.8*  RBC 3.44* 3.84*  HCT 31.3* 35.0*  PLT 223 227    Basename 05/06/12 0449 05/05/12 0450  NA 135 136  K 4.2 4.3  CL 98 100  CO2 29 27  BUN 9 6  CREATININE 0.92 0.97  GLUCOSE 141* 113*  CALCIUM 9.7 9.1   No results found for this basename: LABPT:2,INR:2 in the last 72 hours  EXAM: General - Patient is Alert, Appropriate and Oriented Extremity - Neurovascular intact Sensation intact distally Dorsiflexion/Plantar flexion intact No cellulitis present Incision - clean, dry, no drainage, healing Motor Function - intact, moving foot and toes well on exam.   Assessment/Plan: 3 Days Post-Op Procedure(s) (LRB): TOTAL HIP ARTHROPLASTY (Left) Procedure(s) (LRB): TOTAL HIP ARTHROPLASTY (Left) Past Medical History  Diagnosis Date  . Hyperlipidemia   . Chronic pain syndrome     Knees to feet and low back, on Neurontin.  Marland Kitchen Hypertension   . Allergy   . Osteoporosis   . Arthritis   . Depression    Principal Problem:  *OA  (osteoarthritis) of hip  Estimated Body mass index is 34.77 kg/(m^2) as calculated from the following:   Height as of this encounter: 5\' 7" (1.702 m).   Weight as of this encounter: 222 lb(100.699 kg). Up with therapy Diet - Cardiac diet Follow up - in 2 weeks after surgery Activity - WBAT Disposition - Skilled nursing facility Condition Upon Discharge - Good D/C Meds - See DC Summary DVT Prophylaxis - Xarelto  PERKINS, ALEXZANDREW 05/07/2012, 6:26 AM

## 2012-05-08 ENCOUNTER — Encounter (HOSPITAL_COMMUNITY): Payer: Self-pay | Admitting: Orthopedic Surgery

## 2012-05-25 ENCOUNTER — Ambulatory Visit: Payer: Medicare Other | Admitting: Family Medicine

## 2012-06-15 ENCOUNTER — Ambulatory Visit: Payer: Medicare Other | Admitting: Family Medicine

## 2012-06-25 ENCOUNTER — Ambulatory Visit: Payer: Medicare Other | Attending: Orthopedic Surgery | Admitting: Physical Therapy

## 2012-06-25 DIAGNOSIS — R262 Difficulty in walking, not elsewhere classified: Secondary | ICD-10-CM | POA: Insufficient documentation

## 2012-06-25 DIAGNOSIS — IMO0001 Reserved for inherently not codable concepts without codable children: Secondary | ICD-10-CM | POA: Insufficient documentation

## 2012-06-25 DIAGNOSIS — M25559 Pain in unspecified hip: Secondary | ICD-10-CM | POA: Insufficient documentation

## 2012-07-02 ENCOUNTER — Encounter: Payer: Medicare Other | Admitting: Physical Therapy

## 2012-07-03 ENCOUNTER — Ambulatory Visit: Payer: Medicare Other | Admitting: Physical Therapy

## 2012-07-09 ENCOUNTER — Encounter: Payer: Medicare Other | Admitting: Physical Therapy

## 2012-07-11 ENCOUNTER — Ambulatory Visit: Payer: Medicare Other | Admitting: Physical Therapy

## 2012-07-16 ENCOUNTER — Encounter: Payer: Medicare Other | Admitting: Physical Therapy

## 2012-07-16 ENCOUNTER — Ambulatory Visit: Payer: Medicare Other | Admitting: Internal Medicine

## 2012-07-18 ENCOUNTER — Ambulatory Visit: Payer: Medicare Other | Admitting: Physical Therapy

## 2012-08-05 ENCOUNTER — Ambulatory Visit (INDEPENDENT_AMBULATORY_CARE_PROVIDER_SITE_OTHER): Payer: Medicare Other | Admitting: Family Medicine

## 2012-08-05 VITALS — BP 122/81 | HR 101 | Temp 98.3°F | Resp 16 | Ht 68.0 in | Wt 218.0 lb

## 2012-08-05 DIAGNOSIS — M545 Low back pain, unspecified: Secondary | ICD-10-CM

## 2012-08-05 DIAGNOSIS — M79605 Pain in left leg: Secondary | ICD-10-CM

## 2012-08-05 DIAGNOSIS — G894 Chronic pain syndrome: Secondary | ICD-10-CM

## 2012-08-05 DIAGNOSIS — M25562 Pain in left knee: Secondary | ICD-10-CM

## 2012-08-05 DIAGNOSIS — E785 Hyperlipidemia, unspecified: Secondary | ICD-10-CM

## 2012-08-05 DIAGNOSIS — M25569 Pain in unspecified knee: Secondary | ICD-10-CM

## 2012-08-05 LAB — COMPREHENSIVE METABOLIC PANEL
ALT: 28 U/L (ref 0–53)
AST: 24 U/L (ref 0–37)
Albumin: 4.5 g/dL (ref 3.5–5.2)
Alkaline Phosphatase: 100 U/L (ref 39–117)
BUN: 13 mg/dL (ref 6–23)
CO2: 22 mEq/L (ref 19–32)
Calcium: 9.9 mg/dL (ref 8.4–10.5)
Chloride: 107 mEq/L (ref 96–112)
Creat: 1.13 mg/dL (ref 0.50–1.35)
Glucose, Bld: 97 mg/dL (ref 70–99)
Potassium: 4.3 mEq/L (ref 3.5–5.3)
Sodium: 138 mEq/L (ref 135–145)
Total Bilirubin: 0.3 mg/dL (ref 0.3–1.2)
Total Protein: 7.5 g/dL (ref 6.0–8.3)

## 2012-08-05 LAB — POCT CBC
Granulocyte percent: 49.2 %G (ref 37–80)
HCT, POC: 49.8 % (ref 43.5–53.7)
Hemoglobin: 15.7 g/dL (ref 14.1–18.1)
Lymph, poc: 2 (ref 0.6–3.4)
MCH, POC: 28.9 pg (ref 27–31.2)
MCHC: 31.5 g/dL — AB (ref 31.8–35.4)
MCV: 91.5 fL (ref 80–97)
MID (cbc): 0.6 (ref 0–0.9)
MPV: 9.9 fL (ref 0–99.8)
POC Granulocyte: 2.5 (ref 2–6.9)
POC LYMPH PERCENT: 39 %L (ref 10–50)
POC MID %: 11.8 %M (ref 0–12)
Platelet Count, POC: 309 10*3/uL (ref 142–424)
RBC: 5.44 M/uL (ref 4.69–6.13)
RDW, POC: 14.7 %
WBC: 5.1 10*3/uL (ref 4.6–10.2)

## 2012-08-05 LAB — LIPID PANEL
Cholesterol: 187 mg/dL (ref 0–200)
HDL: 61 mg/dL (ref >39)
LDL Cholesterol: 115 mg/dL — ABNORMAL HIGH (ref 0–99)
Total CHOL/HDL Ratio: 3.1 Ratio
Triglycerides: 54 mg/dL (ref <150)
VLDL: 11 mg/dL (ref 0–40)

## 2012-08-05 LAB — POCT SEDIMENTATION RATE: POCT SED RATE: 30 mm/hr — AB (ref 0–22)

## 2012-08-05 LAB — PSA: PSA: 1.47 ng/mL (ref <=4.00)

## 2012-08-05 MED ORDER — OXYCODONE HCL 5 MG PO TABS: 5.0000 mg | ORAL_TABLET | ORAL | Status: DC | PRN

## 2012-08-05 NOTE — Progress Notes (Signed)
57 yo who is 3 months post left hip replacement by Dr. Lequita Halt.  His hip continues to hurt just the same as it did before the hip replacement, and then he was told the problem is in his back.  He was given an appointment with Dr. Venetia Maxon for two months from now.  He has lower left back pain.  He has had MRI in the past showing bulging discs.  [facility], but the next day he just cannot move because of the pain.  He has paresthesias down the left shin   He continues to have pain in the left knee with crepitus just like when I checked him last time.  His surgeon said this was normal.  The knee continues to be swollen 4 years after the replacement.  Also, needs blood work  Objective:  NAD  Patient moves slowly, wincing with pain  When he stands. Crepitus present in left knee even when not moving it.  All he has to do is flex his thigh muscles. Left hip scar is moderately hypertrophic and tender. SLR is positive on left.  Assessment:  Persistent knee problem, hip pain, and low back pain  Plan:  MRI of lumbar area neurosurg appt before 2 months.  Low back pain radiating to left leg - Plan: MR Lumbar Spine Wo Contrast, oxyCODONE (OXY IR/ROXICODONE) 5 MG immediate release tablet, Ambulatory referral to Neurosurgery, POCT CBC, Comprehensive metabolic panel, POCT SEDIMENTATION RATE, Lipid panel, PSA  Chronic pain syndrome - Plan: POCT CBC, Comprehensive metabolic panel, POCT SEDIMENTATION RATE, Lipid panel, PSA  Knee joint pain, left - Plan: POCT CBC, Comprehensive metabolic panel, POCT SEDIMENTATION RATE, Lipid panel, PSA  Hyperlipidemia

## 2012-08-06 ENCOUNTER — Encounter: Payer: Self-pay | Admitting: Family Medicine

## 2012-08-07 ENCOUNTER — Other Ambulatory Visit: Payer: Self-pay | Admitting: Radiology

## 2012-08-07 MED ORDER — OMEGA-3-ACID ETHYL ESTERS 1 G PO CAPS: 2.0000 g | ORAL_CAPSULE | Freq: Two times a day (BID) | ORAL | Status: DC

## 2012-08-07 NOTE — Telephone Encounter (Signed)
Patient requesting Lovaza from Dr Milus Glazier, has not gotten this from our office before. Please advise.

## 2012-08-07 NOTE — Telephone Encounter (Signed)
Dr L, do you want to Rx Lovaza for pt to take? I called his pharmacy and he had been taking this fairly regularly, but got his last RF in Nov, 2013. He also was taking simvastatin 20 mg, but got his last RF of it in January.

## 2012-08-07 NOTE — Telephone Encounter (Signed)
PATIENT STATES THAT HE RECENTLY HAD HIP REPLACEMENT SURGERY AND THAT DOCTOR REFERRED HIM TO ANOTHER ORTHOPAEDIC SPECIALIST. [phone] OR [phone].

## 2012-08-07 NOTE — Telephone Encounter (Signed)
Lipid panel from 08/05/12 shows normal cholesterol, I don't think he needs a cholesterol lowering medication.  If pt has further questions about this, would probably direct to Dr. Elbert Ewings who saw the patient

## 2012-08-14 ENCOUNTER — Telehealth: Payer: Self-pay

## 2012-08-14 DIAGNOSIS — M79605 Pain in left leg: Secondary | ICD-10-CM

## 2012-08-14 DIAGNOSIS — M25562 Pain in left knee: Secondary | ICD-10-CM

## 2012-08-14 DIAGNOSIS — M545 Low back pain: Secondary | ICD-10-CM

## 2012-08-14 NOTE — Telephone Encounter (Signed)
pts wife called and states they need to talk with dr Milus Glazier regarding an MRI request. They are being advised the MRI is being denied "due to the wording of the request" They also want to verify that Dr Milus Glazier received a 4 page letter from them

## 2012-08-14 NOTE — Telephone Encounter (Signed)
I am as frustrated as patient must be.  I don't have a 4 page letter from his insurance company.  Let's see if we can get a second opinion on his knee and back from an orthopedist in [place].

## 2012-08-14 NOTE — Telephone Encounter (Signed)
I did get a denial of the MRI scan, patient does not meet criteria for MRI scan, he has to have failed 6 weeks medication and physical therapy, or he has to have a neurologic deficit. Please advise, what do you recommend next?

## 2012-08-20 ENCOUNTER — Ambulatory Visit (INDEPENDENT_AMBULATORY_CARE_PROVIDER_SITE_OTHER): Payer: Medicare Other | Admitting: Internal Medicine

## 2012-08-20 VITALS — BP 130/83 | HR 94 | Temp 98.9°F | Resp 17 | Ht 67.5 in | Wt 221.0 lb

## 2012-08-20 DIAGNOSIS — M62838 Other muscle spasm: Secondary | ICD-10-CM

## 2012-08-20 DIAGNOSIS — M545 Low back pain: Secondary | ICD-10-CM

## 2012-08-20 DIAGNOSIS — IMO0002 Reserved for concepts with insufficient information to code with codable children: Secondary | ICD-10-CM

## 2012-08-20 DIAGNOSIS — M5416 Radiculopathy, lumbar region: Secondary | ICD-10-CM

## 2012-08-20 DIAGNOSIS — M5430 Sciatica, unspecified side: Secondary | ICD-10-CM

## 2012-08-20 DIAGNOSIS — M5432 Sciatica, left side: Secondary | ICD-10-CM

## 2012-08-20 MED ORDER — KETOROLAC TROMETHAMINE 60 MG/2ML IM SOLN
60.0000 mg | Freq: Once | INTRAMUSCULAR | Status: AC
  Administered 2012-08-20: 60 mg via INTRAMUSCULAR

## 2012-08-20 MED ORDER — PREDNISONE 50 MG PO TABS: ORAL_TABLET | ORAL | Status: AC

## 2012-08-20 MED ORDER — OXYCODONE HCL 5 MG PO TABS: 5.0000 mg | ORAL_TABLET | ORAL | Status: DC | PRN

## 2012-08-20 MED ORDER — METHOCARBAMOL 500 MG PO TABS: 500.0000 mg | ORAL_TABLET | Freq: Four times a day (QID) | ORAL | Status: DC | PRN

## 2012-08-20 NOTE — Progress Notes (Signed)
  Subjective:    Patient ID: Jeremiah Goodwin, male    DOB: [birth year], 57 y.o.   MRN: [id number]  HPI Back pain lower mid back pain with radiation down the left leg. No numbness or weakness o f the leg. No loss o f control o f bowels or bladder. Pain is 8/10 moderate to severe and is increased with motion and walking. He is walking with a limp because of the pain. He was seen at [facility] orthopedist today and had an xray of his lower back and was told that he had bone on bone and it was suspected that he had a disc problem . He is scheduled for an  MRI o f the lumbar spine in 2 weeks.   Review of Systems  Musculoskeletal: Positive for back pain.  All other systems reviewed and are negative.       Objective:   Physical Exam  Nursing note and vitals reviewed. Constitutional: He is oriented to person, place, and time. He appears well-developed and well-nourished.  HENT:  Head: Normocephalic.  Right Ear: External ear normal.  Left Ear: External ear normal.  Nose: Nose normal.  Eyes: Conjunctivae are normal. Pupils are equal, round, and reactive to light.  Neck: Normal range of motion.  Cardiovascular: Normal rate, regular rhythm, normal heart sounds and intact distal pulses.   Pulmonary/Chest: Effort normal and breath sounds normal.  Abdominal: Soft.  Musculoskeletal: He exhibits tenderness.  Tenderness o f the lumbar spoine with muscle spasm bilaterally in the lumbar spine area.  Neurological: He is alert and oriented to person, place, and time. He has normal reflexes.  Skin: Skin is warm and dry.  Psychiatric: He has a normal mood and affect. His behavior is normal. Judgment and thought content normal.          Assessment & Plan:

## 2012-08-20 NOTE — Patient Instructions (Signed)
Take meds as directed. See the ortho dr in follow up after your mri which was ordered by the ortho dr. If your symptoms worsen return tot the er offices for reevauationl.

## 2012-08-21 ENCOUNTER — Telehealth: Payer: Self-pay

## 2012-08-21 DIAGNOSIS — M545 Low back pain: Secondary | ICD-10-CM

## 2012-08-21 MED ORDER — METHOCARBAMOL 500 MG PO TABS: 500.0000 mg | ORAL_TABLET | Freq: Four times a day (QID) | ORAL | Status: DC | PRN

## 2012-08-21 NOTE — Telephone Encounter (Signed)
Pt was seen yesterday and was prescribed medicine that Medicare does not cover.  Can we please prescribe the robaxin again? [phone]

## 2012-08-21 NOTE — Telephone Encounter (Signed)
Called him Robaxin was sent in for him yesterday, did he not get this? He states he did not it is resent for him.

## 2012-08-22 ENCOUNTER — Telehealth: Payer: Self-pay

## 2012-08-22 NOTE — Telephone Encounter (Signed)
PATIENT STATES HE CAME IN Sunday FOR BACK PAIN. HE WAS GIVEN A MUSCLE RELAXER THAT HIS INSURANCE WILL NOT COVER. HE CANNOT AFFORD $25.00. HE WOULD LIKE TO HAVE SOMETHING CALLED INTO THE PHARMACY THAT HIS INSURANCE WILL COVER. HE HAS MEDICARE COMPLETE AARP.  BEST PHONE ([phone]  [phone] (CELL) OR [phone] (HOME)   PHARMACY CHOICE IS [facility] ON [address].   MBC

## 2012-08-23 MED ORDER — BACLOFEN 10 MG PO TABS: 5.0000 mg | ORAL_TABLET | Freq: Three times a day (TID) | ORAL | Status: DC | PRN

## 2012-08-23 NOTE — Telephone Encounter (Signed)
Baclofen sent to pharmacy. 

## 2012-08-23 NOTE — Telephone Encounter (Signed)
Medicare will only cover Baclofen and Zanaflex, please advise.

## 2012-08-23 NOTE — Telephone Encounter (Signed)
Called patient to advise  °

## 2012-09-04 ENCOUNTER — Ambulatory Visit (INDEPENDENT_AMBULATORY_CARE_PROVIDER_SITE_OTHER): Payer: Medicare Other | Admitting: Family Medicine

## 2012-09-04 VITALS — BP 142/84 | HR 84 | Temp 98.7°F | Resp 16 | Ht 67.5 in | Wt 219.0 lb

## 2012-09-04 DIAGNOSIS — M549 Dorsalgia, unspecified: Secondary | ICD-10-CM

## 2012-09-04 MED ORDER — OXYCODONE-ACETAMINOPHEN 10-325 MG PO TABS: 1.0000 | ORAL_TABLET | Freq: Three times a day (TID) | ORAL | Status: DC | PRN

## 2012-09-04 NOTE — Progress Notes (Signed)
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  The pain has not improved with recent medications.  He saw Dr. Shon Baton recently and offered spinal fusion.  He recommends a second opinion and pain clinic.  [facility] and [facility] and using the whirlpool  Objective:  Alert, NAD We discussed the chronicity of the disease.  Moving legs well, though he has knee sleeve on.  Assessment:  I don't think this pain is going away any time soon.    Plan:  Refer to pain specialist (Dr. Vear Clock) and get second opinion.

## 2012-09-18 ENCOUNTER — Other Ambulatory Visit: Payer: Self-pay | Admitting: Physician Assistant

## 2012-09-19 ENCOUNTER — Other Ambulatory Visit: Payer: Self-pay | Admitting: Family Medicine

## 2012-09-26 ENCOUNTER — Telehealth: Payer: Self-pay

## 2012-09-26 NOTE — Telephone Encounter (Signed)
Dr. Milus Glazier: Patient is having a hard time with prescriptions Actonel and Lovaza.  Patient changed pharmacies and is having a difficult time getting prescriptions filled. [facility] on [address]. Doesn't have the generic of Lovaza so they are having to use [facility] on [address].  The problem is that [facility] does not have a record of a prescription for Lovaza for this patient and needs one on file.  You can reach the patient at [phone].

## 2012-09-30 ENCOUNTER — Ambulatory Visit (INDEPENDENT_AMBULATORY_CARE_PROVIDER_SITE_OTHER): Payer: Medicare Other | Admitting: Family Medicine

## 2012-09-30 ENCOUNTER — Encounter: Payer: Self-pay | Admitting: Family Medicine

## 2012-09-30 VITALS — BP 132/84 | HR 78 | Temp 98.4°F | Resp 18 | Ht 67.5 in | Wt 214.0 lb

## 2012-09-30 DIAGNOSIS — M62838 Other muscle spasm: Secondary | ICD-10-CM

## 2012-09-30 DIAGNOSIS — I10 Essential (primary) hypertension: Secondary | ICD-10-CM

## 2012-09-30 DIAGNOSIS — E785 Hyperlipidemia, unspecified: Secondary | ICD-10-CM

## 2012-09-30 DIAGNOSIS — M549 Dorsalgia, unspecified: Secondary | ICD-10-CM

## 2012-09-30 MED ORDER — SIMVASTATIN 40 MG PO TABS: 40.0000 mg | ORAL_TABLET | Freq: Every evening | ORAL | Status: DC

## 2012-09-30 MED ORDER — GABAPENTIN 300 MG PO CAPS: 300.0000 mg | ORAL_CAPSULE | Freq: Three times a day (TID) | ORAL | Status: DC

## 2012-09-30 MED ORDER — BACLOFEN 10 MG PO TABS: ORAL_TABLET | ORAL | Status: DC

## 2012-09-30 MED ORDER — LISINOPRIL 10 MG PO TABS: 10.0000 mg | ORAL_TABLET | Freq: Every day | ORAL | Status: DC

## 2012-09-30 NOTE — Patient Instructions (Signed)
    August 14, 2012   Clinical Data: 56 year old male with low back pain for 3-4 months.  Prior lumbar surgery.  MRI LUMBAR SPINE WITHOUT AND WITH CONTRAST  Technique: Multiplanar and multiecho pulse sequences of the lumbar  spine were obtained without and with intravenous contrast.  Contrast: 20 ml MultiHance.  Comparison: Lumbar radiographs 06/14/2008. MRI 03/09/2003.  Findings: Sequelae of L4-L5 and L5-S1 posterior and interbody  fusion re-identified with hardware susceptibility artifact.  Bladder wall thickening. Otherwise, visualized abdominal viscera  within normal limits. Sequelae of decompressive laminectomy also  at the operated levels. No acute paraspinal soft tissue findings  identified. Conus medullaris occurs at the L1 level. Spinal cord  signal is within normal limits at all visualized levels. No  abnormal enhancement identified. Mild marrow edema involving the  right superior L3 endplate, likely degenerative. Otherwise, no  acute osseous abnormality.  T12-L1: Stable, negative.  L1-L2: Progression of spinal stenosis, AP thecal sac now measuring  5-6 mm (previously 7-8 mm). Moderate circumferential disc bulge is  re-identified at the level and there is new extrusion and caudal  migration of disc material centrally (series 7 image 90) with  continued mass effect on the ventral thecal sac to the mid L2  level. Stable moderate facet and ligament flavum hypertrophy.  There is effacement of CSF from the sac. Stable neural foramen.  L2-L3: Increase spinal stenosis, primarily due to epidural  lipomatosis superimposed on stable moderate facet and ligament  flavum hypertrophy. Mild bilateral L2 foraminal stenosis is mildly  progressed.  L3-L4: Moderate to severe spinal stenosis is not significantly  changed (AP thecal sac approximating 5 mm). Again this is related  to moderate to severe circumferential disc protrusion and facet,  ligamentous hypertrophy. Moderate to severe  left L3 and mild to  moderate right L3 neural foraminal stenosis has progressed and is  also multifactorial.  L4-L5: Postoperative changes with stable patency of the thecal sac  neural foramen. Evidence of interbody ankylosis is now seen.  L5-S1: Postoperative changes with stable thecal sac and neural  foraminal patency. Evidence of interbody ankylosis now seen.  Vestigial S1-S2 disc space re-identified. S1 neural foramen remain  normal.  IMPRESSION:  1. Multifactorial moderate to severe spinal stenosis at L3-L4 is  not significantly changed, but moderate to severe left and mild to  moderate right L3 foraminal stenosis due appear progressed.  2. Progression of disc degeneration at L1-L2, including new caudal  extrusion of disc material, now resulting in moderate spinal  stenosis.  3. Increased spinal stenosis at L2-L3, primarily due to epidural  lipomatosis.  4. Postoperative changes at L4-L5 and L5-S1 with no adverse  features identified.  5. Bladder wall thickening. Correlate for any dysuria which may  indicate urinary tract infection.  Provider: Council Mechanic, Kathrine Cords

## 2012-09-30 NOTE — Progress Notes (Signed)
57 yo with chronic back pain who has appointment with pain clinic tomorrow.  He has had anxiety problems with deaths in family.  He has taken blue xanax before and would like to get some refill.  Cousin is on life support.  Good friend recently died suddenly at 43 yo.  Gait is unstable and he needs a walking stick.  There is left sciatic type pain as well.  Also complains of ED  Objective:  NAD Wearing left knee brace.  We discussed the above problems face to face.  Clinical Data: 57 year old male with low back pain for 3-4 months.  Prior lumbar surgery.  MRI LUMBAR SPINE WITHOUT AND WITH CONTRAST  Technique: Multiplanar and multiecho pulse sequences of the lumbar  spine were obtained without and with intravenous contrast.  Contrast: 20 ml MultiHance.  Comparison: Lumbar radiographs 06/14/2008. MRI 03/09/2003.  Findings: Sequelae of L4-L5 and L5-S1 posterior and interbody  fusion re-identified with hardware susceptibility artifact.  Bladder wall thickening. Otherwise, visualized abdominal viscera  within normal limits. Sequelae of decompressive laminectomy also  at the operated levels. No acute paraspinal soft tissue findings  identified. Conus medullaris occurs at the L1 level. Spinal cord  signal is within normal limits at all visualized levels. No  abnormal enhancement identified. Mild marrow edema involving the  right superior L3 endplate, likely degenerative. Otherwise, no  acute osseous abnormality.  T12-L1: Stable, negative.  L1-L2: Progression of spinal stenosis, AP thecal sac now measuring  5-6 mm (previously 7-8 mm). Moderate circumferential disc bulge is  re-identified at the level and there is new extrusion and caudal  migration of disc material centrally (series 7 image 90) with  continued mass effect on the ventral thecal sac to the mid L2  level. Stable moderate facet and ligament flavum hypertrophy.  There is effacement of CSF from the sac. Stable neural foramen.   L2-L3: Increase spinal stenosis, primarily due to epidural  lipomatosis superimposed on stable moderate facet and ligament  flavum hypertrophy. Mild bilateral L2 foraminal stenosis is mildly  progressed.  L3-L4: Moderate to severe spinal stenosis is not significantly  changed (AP thecal sac approximating 5 mm). Again this is related  to moderate to severe circumferential disc protrusion and facet,  ligamentous hypertrophy. Moderate to severe left L3 and mild to  moderate right L3 neural foraminal stenosis has progressed and is  also multifactorial.  L4-L5: Postoperative changes with stable patency of the thecal sac  neural foramen. Evidence of interbody ankylosis is now seen.  L5-S1: Postoperative changes with stable thecal sac and neural  foraminal patency. Evidence of interbody ankylosis now seen.  Vestigial S1-S2 disc space re-identified. S1 neural foramen remain  normal.  IMPRESSION:  1. Multifactorial moderate to severe spinal stenosis at L3-L4 is  not significantly changed, but moderate to severe left and mild to  moderate right L3 foraminal stenosis due appear progressed.  2. Progression of disc degeneration at L1-L2, including new caudal  extrusion of disc material, now resulting in moderate spinal  stenosis.  3. Increased spinal stenosis at L2-L3, primarily due to epidural  lipomatosis.  4. Postoperative changes at L4-L5 and L5-S1 with no adverse  features identified.  5. Bladder wall thickening. Correlate for any dysuria which may  indicate urinary tract infection.  Provider: Council Mechanic, Kathrine Cords   Assessment:  Back pain is chronic, hypertension, back muscle spasms, ED, anxiety  Plan: Hypertension - Plan: lisinopril (PRINIVIL,ZESTRIL) 10 MG tablet  Hyperlipidemia - Plan: simvastatin (ZOCOR) 40 MG tablet  Muscle spasm - Plan: baclofen (LIORESAL) 10 MG tablet  Back pain - Plan: gabapentin (NEURONTIN) 300 MG capsule  Cialis 2.5 qd Xanax 1 mg bid prn #60  with 3 refills Pain clinic referral.  Signed, Elvina Sidle, MD

## 2012-10-05 MED ORDER — OMEGA-3-ACID ETHYL ESTERS 1 G PO CAPS: 2.0000 g | ORAL_CAPSULE | Freq: Two times a day (BID) | ORAL | Status: DC

## 2012-10-05 NOTE — Telephone Encounter (Signed)
Verified w/pt that he just needs his Lovaza Rx sent to [facility], all other meds are fine as far as he knows. Sent in Rx to [facility]

## 2012-11-21 ENCOUNTER — Other Ambulatory Visit: Payer: Self-pay | Admitting: Internal Medicine

## 2013-01-10 ENCOUNTER — Ambulatory Visit (INDEPENDENT_AMBULATORY_CARE_PROVIDER_SITE_OTHER): Payer: Medicare Other | Admitting: Family Medicine

## 2013-01-10 VITALS — BP 110/72 | HR 81 | Temp 98.5°F | Resp 18 | Ht 67.0 in | Wt 209.0 lb

## 2013-01-10 DIAGNOSIS — H9319 Tinnitus, unspecified ear: Secondary | ICD-10-CM

## 2013-01-10 DIAGNOSIS — M899 Disorder of bone, unspecified: Secondary | ICD-10-CM

## 2013-01-10 DIAGNOSIS — F341 Dysthymic disorder: Secondary | ICD-10-CM

## 2013-01-10 DIAGNOSIS — H9312 Tinnitus, left ear: Secondary | ICD-10-CM

## 2013-01-10 DIAGNOSIS — M8580 Other specified disorders of bone density and structure, unspecified site: Secondary | ICD-10-CM

## 2013-01-10 DIAGNOSIS — M549 Dorsalgia, unspecified: Secondary | ICD-10-CM

## 2013-01-10 DIAGNOSIS — Z23 Encounter for immunization: Secondary | ICD-10-CM

## 2013-01-10 DIAGNOSIS — G8929 Other chronic pain: Secondary | ICD-10-CM

## 2013-01-10 DIAGNOSIS — F418 Other specified anxiety disorders: Secondary | ICD-10-CM

## 2013-01-10 DIAGNOSIS — I10 Essential (primary) hypertension: Secondary | ICD-10-CM

## 2013-01-10 MED ORDER — FLUOXETINE HCL 20 MG PO TABS: 20.0000 mg | ORAL_TABLET | Freq: Every day | ORAL | Status: DC

## 2013-01-10 MED ORDER — ALPRAZOLAM 1 MG PO TABS: 1.0000 mg | ORAL_TABLET | Freq: Two times a day (BID) | ORAL | Status: DC | PRN

## 2013-01-10 NOTE — Patient Instructions (Addendum)
We will refer you to ENT for the ringing in your ears. We will schedule another bone density to help in your decision for injections in the back, but the bone mass was better in 2010. Discuss your concerns an options with pain management tomorrow.  If you are able to be seen by Dr. Fredrich Birks office - let us know and we can try to refer you again.  Start Prozac once per day, xanax up to 2 times per day if needed. Follow up with Dr. Milus Glazier in the next 3-4 weeks.  Return to the clinic or go to the nearest emergency room if any of your symptoms worsen or new symptoms occur.   [facility] Policy for Prescribing Controlled Substances (Revised 02/2012) 1. Prescriptions for controlled substances will be filled by ONE provider at [facility] with whom you have established and developed a plan for your care, including follow-up. 2. You are encouraged to schedule an appointment with your prescriber at our appointment center for follow-up visits whenever possible. 3. If you request a prescription for the controlled substance while at [facility] for an acute problem (with someone other than your regular prescriber), you MAY be given a ONE-TIME prescription for a 30-day supply of the controlled substance, to allow time for you to return to see your regular prescriber for additional prescriptions.

## 2013-01-10 NOTE — Progress Notes (Signed)
Subjective:    Patient ID: Jeremiah Goodwin, male    DOB: 1955-06-17, 57 y.o.   MRN: [id number]  HPI Jeremiah Goodwin is a 57 y.o. male PCP: GUEST, Loretha Stapler, MD But followed by Dr. Milus Glazier as well.   Here for multiple concerns today.   chronic LBP - referred to pain clinic by Dr. Milus Glazier in June of this year. Appt with pain clinic tomorrow.  Planning on injection of some type into back, but "can't do that because has osteoporosis, and not good for bones". Told by Dr. Venetia Maxon in past (about 8 years ago) to not have any more injections due to osteoporosis. Few months ago - saw Dr. Shon Baton (same office as where had knee and hip surgeries)- discussed fusion of 4-5-6, but patient does not want to have surgery again, and does not want injection.  He would like [place] to write a letter to his pain management clinic to increase the dose of his pain medicine. Has discussed this with pain mgt, but told only way to increase meds was to have the injection? He apparently did advise them of the concern of osteoporosis and prior recommendation of not getting another injection. Would like to meet with Dr. Venetia Maxon again, as he did prior surgeries. Return to the clinic or go to the nearest emergency room if any of your symptoms worsen or new symptoms occur. No bowel or bladder incontinence, no saddle anesthesia, no lower extremity weakness - walking ok.     Anxiety - prescribed 1mg  BID prn #60 with 3 refills on 09/30/12.Situational stressors then with. He would like me to increase the dose of xanax - taking 2 per day of the 1mg . Wants to take 3 per day for the depression. Hurting, and feeling depressed with his pain and concerns over surgery and injections. . Denies suicidal or homicidal thoughts. Smoking more cigarettes. Feeling depressed for past year - unable to work, but is going to [employer] - water based exercise.  No alcohol. Cymbalta made him feel jittery - last took a couple of years ago - took for 2 months. No known hx  of Bipolar d/o. Elavil 25mg  qhs helps with sleep. Last filled xanax on 12/25/12 (filled 7/6, 8/2, then 9/2).   Ear ringing, L ear,  past year - comes and goes. No recent worsening, minimal soreness at times on left. Able to hear out of ear, just ringing at times.   Patient Active Problem List   Diagnosis Date Noted  . OA (osteoarthritis) of hip 05/04/2012  . HTN (hypertension) 03/16/2012  . Chronic pain syndrome 05/30/2011  . Cervical disc disease 05/30/2011  . Lumbar disc disease 05/30/2011  . OBESITY 09/01/2008  . HYPERCHOLESTEROLEMIA 08/20/2008  . GASTROESOPHAGEAL REFLUX DISEASE 07/11/2008  . DYSPHAGIA UNSPECIFIED 07/11/2008  . TOBACCO ABUSE 06/18/2008  . BACK PAIN, CHRONIC 06/18/2008  . SLEEP DISORDER 06/18/2008  . TOTAL KNEE REPLACEMENT, LEFT, HX OF 04/25/2005  . ERECTILE DYSFUNCTION 05/10/2003  . ANXIETY 02/07/2003  . OSTEOPOROSIS 01/10/2003  . Other postprocedural status 08/24/2002   Past Medical History  Diagnosis Date  . Hyperlipidemia   . Chronic pain syndrome     Knees to feet and low back, on Neurontin.  Marland Kitchen Hypertension   . Allergy   . Osteoporosis   . Arthritis   . Depression    Past Surgical History  Procedure Laterality Date  . Spine surgery      Has had surgery spine and low back  . Joint replacement  Left total knee arthroplasty  . Back surgery      8 different times  . Hemorrhoid surgery    . Total hip arthroplasty  05/04/2012    Procedure: TOTAL HIP ARTHROPLASTY;  Surgeon: Loanne Drilling, MD;  Location: [facility];  Service: Orthopedics;  Laterality: Left;   Allergies  Allergen Reactions  . Hydrocodone-Acetaminophen Itching    REACTION: itching   Prior to Admission medications   Medication Sig Start Date End Date Taking? Authorizing Provider  ACTONEL 35 MG tablet take 1 tablet by mouth every week 11/21/12  Yes Elvina Sidle, MD  amitriptyline (ELAVIL) 25 MG tablet Take 1 tablet (25 mg total) by mouth at bedtime. 03/16/12  Yes Jonita Albee, MD   baclofen (LIORESAL) 10 MG tablet 1/2 to 1 tid prn muscle spasm 09/30/12  Yes Elvina Sidle, MD  docusate sodium 100 MG CAPS Take 100 mg by mouth 2 (two) times daily. 05/06/12  Yes Alexzandrew Julien Girt, PA-C  gabapentin (NEURONTIN) 300 MG capsule Take 1-2 capsules (300-600 mg total) by mouth 3 (three) times daily. Takes 2 in the morning 1 at lunch and 2 at night 09/30/12  Yes Elvina Sidle, MD  lisinopril (PRINIVIL,ZESTRIL) 10 MG tablet Take 1 tablet (10 mg total) by mouth daily. 09/30/12  Yes Elvina Sidle, MD  omega-3 acid ethyl esters (LOVAZA) 1 G capsule Take 2 capsules (2 g total) by mouth 2 (two) times daily. 10/05/12  Yes Eleanore Delia Chimes, PA-C  oxyCODONE-acetaminophen (PERCOCET) 10-325 MG per tablet Take 1 tablet by mouth every 8 (eight) hours as needed for pain. 09/04/12  Yes Elvina Sidle, MD  simvastatin (ZOCOR) 40 MG tablet Take 1 tablet (40 mg total) by mouth every evening. 09/30/12  Yes Elvina Sidle, MD   History   Social History  . Marital Status: Divorced    Spouse Name: N/A    Number of Children: N/A  . Years of Education: N/A   Occupational History  . Not on file.   Social History Main Topics  . Smoking status: Current Every Day Smoker -- 0.33 packs/day for 42 years    Types: Cigarettes  . Smokeless tobacco: Not on file  . Alcohol Use: 0.0 oz/week     Comment: rarely drinks   . Drug Use: No  . Sexual Activity: No   Other Topics Concern  . Not on file   Social History Narrative  . No narrative on file    Review of Systems  HENT: Positive for tinnitus. Negative for hearing loss.   Musculoskeletal: Positive for back pain (chronic).  Psychiatric/Behavioral: Negative for suicidal ideas and self-injury. The patient is nervous/anxious.        Objective:   Physical Exam  Vitals reviewed. Constitutional: He is oriented to person, place, and time. He appears well-developed and well-nourished.  HENT:  Head: Normocephalic and atraumatic.  Right Ear: Tympanic  membrane, external ear and ear canal normal.  Left Ear: Tympanic membrane, external ear and ear canal normal.  Nose: No rhinorrhea.  Mouth/Throat: Oropharynx is clear and moist and mucous membranes are normal. No oropharyngeal exudate or posterior oropharyngeal erythema.  Audiometry performed: mild loss at mid to high frequencies bilaterally. See scanned copy.   Eyes: Conjunctivae are normal. Pupils are equal, round, and reactive to light.  Neck: Neck supple.  Cardiovascular: Normal rate, regular rhythm, normal heart sounds and intact distal pulses.   No murmur heard. Pulmonary/Chest: Effort normal and breath sounds normal. He has no wheezes. He has no rhonchi. He has  no rales.  Abdominal: Soft. There is no tenderness.  Musculoskeletal:  Well healed scars neck and low back.  Able to heel and toe walk.  Unable to illicit knee reflexes, but 2+ at ankles.   Lymphadenopathy:    He has no cervical adenopathy.  Neurological: He is alert and oriented to person, place, and time.  Skin: Skin is warm and dry. No rash noted.  Psychiatric: He has a normal mood and affect. His behavior is normal.    Review of chart - had bone density testing in 2010: DUAL X-RAY ABSORPTIOMETRY (DXA) FOR BONE MINERAL DENSITY  LEFT FEMUR NECK  Bone Mineral Density (BMD): 0.734 g/cm2  Young Adult T Score: -1.4  Z Score: -1.1  LEFT FOREARM  Bone Mineral Density (BMD): 0.896 g/cm2  Young Adult T Score: 1.5  Z Score: 2.0  ASSESSMENT: Patient's diagnostic category is LOW BONE MASS by WHO  Criteria.  FRACTURE RISK: UNKNOWN  FRAX: World Health Organization FRAX assessment of absolute  fracture risk is not calculated for this patient because the  patient has a history of bone building therapy.  The spine could not be evaluated today as there was only one  evaluable vertebral body. Surgical hardware is present at L4.  There are degenerative changes present at L3 and there is a greater  than one standard deviation  difference in T-score between L1-L2.  Comparison: There has been a statistically significant 6.6%  increase in bone mineral density in the total left hip as compared  to prior study of 11/15/2002.     Assessment & Plan:  Jeremiah Goodwin is a 57 y.o. male Need for prophylactic vaccination and inoculation against influenza - Plan: Flu Vaccine QUAD 36+ mos PF IM (Fluarix)  Chronic back pain  Low bone mass - Plan: DG Bone Density  Tinnitus, left - Plan: Ambulatory referral to ENT  Depression with anxiety - Plan: FLUoxetine (PROZAC) 20 MG tablet, ALPRAZolam (XANAX) 1 MG tablet   Chronic back pain, LBP. Followed by pain management, and had recommendation for surgery by Dr. Shon Baton, but does not want to have this done. By report, he is unable to have increase in his pain medicines until he has an injection, but does not want injection based on prior recommendations from surgeon to not have further injections with hx of osteoporosis. Requested referral to Dr. Venetia Maxon, but in chart this appears to have been ordered in April and May of this year, but denied as he was dismissed from their practice. Encouraged to discuss concerns of injection and other options again with his pain management provider  Offered repeat BMD to determine level of bone density and relative risk of injections. Will schedule this, but one in 2010 actually appeared improved with "low bone mass".   Depression with anxiety - suspect component of this with chronic pain. Did not tolerate Cymbalta in past. Can Try Prozac 20mg  QD - SED, continue xanax 1mg  BID for now - #60 to be filled 01/24/13, but further refills from primary provider. Reviewed [facility] controlled substance policy. Rtc/er/911 precautions discussed.  Tinnitus - discussed at 02/2012 visit, but has not seen ENT.  Audiometry today. Mild loss.  Will refer to ENT for further eval. Background noise discussed, ear protection for any loud noise exposure.   Discussed/recommended  continuity of care - he plan to return to see Dr. Milus Glazier in the next 3-4 weeks.   Meds ordered this encounter  Medications  . FLUoxetine (PROZAC) 20 MG tablet  Sig: Take 1 tablet (20 mg total) by mouth daily.    Dispense:  30 tablet    Refill:  3  . ALPRAZolam (XANAX) 1 MG tablet    Sig: Take 1 tablet (1 mg total) by mouth 2 (two) times daily as needed for anxiety.    Dispense:  60 tablet    Refill:  0    Do not fill before 01/24/13.     Patient Instructions  We will refer you to ENT for the ringing in your ears. We will schedule another bone density to help in your decision for injections in the back, but the bone mass was better in 2010. Discuss your concerns an options with pain management tomorrow.  If you are able to be seen by Dr. Fredrich Birks office - let us know and we can try to refer you again.  Start Prozac once per day, xanax up to 2 times per day if needed. Follow up with Dr. Milus Glazier in the next 3-4 weeks.  Return to the clinic or go to the nearest emergency room if any of your symptoms worsen or new symptoms occur.   [facility] Policy for Prescribing Controlled Substances (Revised 02/2012) 1. Prescriptions for controlled substances will be filled by ONE provider at [facility] with whom you have established and developed a plan for your care, including follow-up. 2. You are encouraged to schedule an appointment with your prescriber at our appointment center for follow-up visits whenever possible. 3. If you request a prescription for the controlled substance while at [facility] for an acute problem (with someone other than your regular prescriber), you MAY be given a ONE-TIME prescription for a 30-day supply of the controlled substance, to allow time for you to return to see your regular prescriber for additional prescriptions.

## 2013-02-13 ENCOUNTER — Ambulatory Visit (INDEPENDENT_AMBULATORY_CARE_PROVIDER_SITE_OTHER): Payer: Medicare Other | Admitting: Family Medicine

## 2013-02-13 VITALS — BP 118/84 | HR 105 | Temp 98.4°F | Resp 18 | Ht 67.0 in | Wt 207.0 lb

## 2013-02-13 DIAGNOSIS — G4700 Insomnia, unspecified: Secondary | ICD-10-CM

## 2013-02-13 DIAGNOSIS — Z0000 Encounter for general adult medical examination without abnormal findings: Secondary | ICD-10-CM

## 2013-02-13 DIAGNOSIS — F411 Generalized anxiety disorder: Secondary | ICD-10-CM

## 2013-02-13 DIAGNOSIS — H9313 Tinnitus, bilateral: Secondary | ICD-10-CM

## 2013-02-13 DIAGNOSIS — F419 Anxiety disorder, unspecified: Secondary | ICD-10-CM

## 2013-02-13 DIAGNOSIS — I10 Essential (primary) hypertension: Secondary | ICD-10-CM

## 2013-02-13 DIAGNOSIS — J3489 Other specified disorders of nose and nasal sinuses: Secondary | ICD-10-CM

## 2013-02-13 DIAGNOSIS — R0981 Nasal congestion: Secondary | ICD-10-CM

## 2013-02-13 DIAGNOSIS — E785 Hyperlipidemia, unspecified: Secondary | ICD-10-CM

## 2013-02-13 LAB — COMPREHENSIVE METABOLIC PANEL
ALT: 19 U/L (ref 0–53)
AST: 18 U/L (ref 0–37)
Albumin: 4.6 g/dL (ref 3.5–5.2)
Alkaline Phosphatase: 104 U/L (ref 39–117)
BUN: 8 mg/dL (ref 6–23)
CO2: 27 mEq/L (ref 19–32)
Calcium: 10.1 mg/dL (ref 8.4–10.5)
Chloride: 102 mEq/L (ref 96–112)
Creat: 1.05 mg/dL (ref 0.50–1.35)
Glucose, Bld: 93 mg/dL (ref 70–99)
Potassium: 4.2 mEq/L (ref 3.5–5.3)
Sodium: 138 mEq/L (ref 135–145)
Total Bilirubin: 0.6 mg/dL (ref 0.3–1.2)
Total Protein: 7.9 g/dL (ref 6.0–8.3)

## 2013-02-13 LAB — POCT CBC
Granulocyte percent: 56.3 %G (ref 37–80)
HCT, POC: 46.4 % (ref 43.5–53.7)
Hemoglobin: 14.9 g/dL (ref 14.1–18.1)
Lymph, poc: 2.1 (ref 0.6–3.4)
MCH, POC: 30.8 pg (ref 27–31.2)
MCHC: 32.1 g/dL (ref 31.8–35.4)
MCV: 96.1 fL (ref 80–97)
MID (cbc): 0.7 (ref 0–0.9)
MPV: 9.9 fL (ref 0–99.8)
POC Granulocyte: 3.6 (ref 2–6.9)
POC LYMPH PERCENT: 33.4 %L (ref 10–50)
POC MID %: 10.3 %M (ref 0–12)
Platelet Count, POC: 274 10*3/uL (ref 142–424)
RBC: 4.83 M/uL (ref 4.69–6.13)
RDW, POC: 13.3 %
WBC: 6.4 10*3/uL (ref 4.6–10.2)

## 2013-02-13 LAB — LIPID PANEL
Cholesterol: 150 mg/dL (ref 0–200)
HDL: 60 mg/dL (ref >39)
LDL Cholesterol: 79 mg/dL (ref 0–99)
Total CHOL/HDL Ratio: 2.5 Ratio
Triglycerides: 57 mg/dL (ref <150)
VLDL: 11 mg/dL (ref 0–40)

## 2013-02-13 MED ORDER — ALPRAZOLAM 1 MG PO TABS: 1.0000 mg | ORAL_TABLET | Freq: Two times a day (BID) | ORAL | Status: DC | PRN

## 2013-02-13 MED ORDER — LISINOPRIL 10 MG PO TABS: 10.0000 mg | ORAL_TABLET | Freq: Every day | ORAL | Status: DC

## 2013-02-13 MED ORDER — PREDNISONE 20 MG PO TABS: ORAL_TABLET | ORAL | Status: DC

## 2013-02-13 MED ORDER — AMITRIPTYLINE HCL 50 MG PO TABS: 50.0000 mg | ORAL_TABLET | Freq: Every day | ORAL | Status: DC

## 2013-02-13 MED ORDER — ALPRAZOLAM 1 MG PO TABS: 1.0000 mg | ORAL_TABLET | Freq: Three times a day (TID) | ORAL | Status: DC | PRN

## 2013-02-13 NOTE — Progress Notes (Signed)
Patient ID: Jeremiah Goodwin, male   DOB: August 17, 1955, 57 y.o.   MRN: [id number]  @[email or web address] @  Patient ID: Jeremiah Goodwin MRN: [id number], DOB: March 29, 1956, 57 y.o. Date of Encounter: 02/13/2013, 1:32 PM This chart was scribed for Elvina Sidle, MD by Valera Castle, ED Scribe. This patient was seen in room 10 and the patient's care was started at 1:32 PM.   Primary Physician: Tally Due, MD  Chief Complaint: Discuss Medication, Body aches, Cough, Congestion  HPI: 57 y.o. year old male with a h/o chronic back pain and history below presents for a discussion about his medications. He also reports sudden, moderate, generalized pain, onset 1 week ago, with associated cough, productive of sputum, and congestion. He states he thinks he has taken Prednisone before.   He reports associated intermittent, ringing in his ears.   He reports the pain clinic wants to give him an injection in his back, and would like a letter. He states that when he turns his neck he can feel a crackling sound. He reports taking Oxycodone twice a day, with no relief. He reports he doesn't want to be cut on again even though they have told him he needs surgery.   He reports being on Gabapentin regularly for more than a year, with no relief.   He requests a colonoscopy and blood work.   He denies any other associated symptoms.    Past Medical History  Diagnosis Date  . Hyperlipidemia   . Chronic pain syndrome     Knees to feet and low back, on Neurontin.  Marland Kitchen Hypertension   . Allergy   . Osteoporosis   . Arthritis   . Depression      Home Meds: Prior to Admission medications   Medication Sig Start Date End Date Taking? Authorizing Provider  ACTONEL 35 MG tablet take 1 tablet by mouth every week 11/21/12  Yes Elvina Sidle, MD  ALPRAZolam Prudy Feeler) 1 MG tablet Take 1 tablet (1 mg total) by mouth 2 (two) times daily as needed for anxiety. 01/10/13  Yes Shade Flood, MD  amitriptyline (ELAVIL) 25 MG tablet Take  1 tablet (25 mg total) by mouth at bedtime. 03/16/12  Yes Jonita Albee, MD  aspirin EC 81 MG tablet Take 81 mg by mouth daily.   Yes Historical Provider, MD  baclofen (LIORESAL) 10 MG tablet 1/2 to 1 tid prn muscle spasm 09/30/12  Yes Elvina Sidle, MD  docusate sodium 100 MG CAPS Take 100 mg by mouth 2 (two) times daily. 05/06/12  Yes Alexzandrew Julien Girt, PA-C  gabapentin (NEURONTIN) 300 MG capsule Take 1-2 capsules (300-600 mg total) by mouth 3 (three) times daily. Takes 2 in the morning 1 at lunch and 2 at night 09/30/12  Yes Elvina Sidle, MD  lisinopril (PRINIVIL,ZESTRIL) 10 MG tablet Take 1 tablet (10 mg total) by mouth daily. 09/30/12  Yes Elvina Sidle, MD  omega-3 acid ethyl esters (LOVAZA) 1 G capsule Take 2 capsules (2 g total) by mouth 2 (two) times daily. 10/05/12  Yes Eleanore Delia Chimes, PA-C  oxyCODONE-acetaminophen (PERCOCET) 10-325 MG per tablet Take 1 tablet by mouth every 8 (eight) hours as needed for pain. 09/04/12  Yes Elvina Sidle, MD  simvastatin (ZOCOR) 40 MG tablet Take 1 tablet (40 mg total) by mouth every evening. 09/30/12  Yes Elvina Sidle, MD  FLUoxetine (PROZAC) 20 MG tablet Take 1 tablet (20 mg total) by mouth daily. 01/10/13   Shade Flood, MD    Allergies:  Allergies  Allergen Reactions  . Hydrocodone-Acetaminophen Itching    REACTION: itching    History   Social History  . Marital Status: Divorced    Spouse Name: N/A    Number of Children: N/A  . Years of Education: N/A   Occupational History  . Not on file.   Social History Main Topics  . Smoking status: Current Every Day Smoker -- 0.33 packs/day for 42 years    Types: Cigarettes  . Smokeless tobacco: Not on file  . Alcohol Use: 0.0 oz/week     Comment: rarely drinks   . Drug Use: No  . Sexual Activity: No   Other Topics Concern  . Not on file   Social History Narrative  . No narrative on file     Review of Systems: Constitutional: negative for chills, fever, night sweats, weight  changes, or fatigue  HEENT: negative for vision changes, hearing loss, rhinorrhea, ST, epistaxis, or sinus pressure Positive for congestion Cardiovascular: negative for chest pain or palpitations Respiratory: negative for hemoptysis, wheezing, shortness of breath, Positive for productive cough Abdominal: negative for abdominal pain, nausea, vomiting, diarrhea, or constipation Dermatological: negative for rash Musc/Skeletal: Positive for back pain, generalized body aches Neurologic: negative for headache, dizziness, or syncope All other systems reviewed and are otherwise negative with the exception to those above and in the HPI.   Physical Exam: Blood pressure 118/84, pulse 105, temperature 98.4 F (36.9 C), temperature source Oral, resp. rate 18, height 5\' 7"  (1.702 m), weight 207 lb (93.895 kg), SpO2 97.00%., Body mass index is 32.41 kg/(m^2). General: Well developed, well nourished, in no acute distress. Head: Normocephalic, atraumatic, eyes without discharge, sclera non-icteric, nares are without discharge. Bilateral auditory canals clear, TM's are without perforation, pearly grey and translucent with reflective cone of light bilaterally. Oral cavity moist, posterior pharynx without exudate, erythema, peritonsillar abscess, or post nasal drip.  Neck: Supple. No thyromegaly. Full ROM. No lymphadenopathy. Lungs: Clear bilaterally to auscultation without wheezes, rales, or rhonchi. Breathing is unlabored. Heart: RRR with S1 S2. No murmurs, rubs, or gallops appreciated. Abdomen: Soft, non-tender, non-distended with normoactive bowel sounds. No hepatomegaly. No rebound/guarding. No obvious abdominal masses. Msk:  Strength and tone normal for age. Extremities/Skin: Warm and dry. No clubbing or cyanosis. No edema. No rashes or suspicious lesions. Neuro: Alert and oriented X 3. Moves all extremities spontaneously. Gait is normal. CNII-XII grossly in tact. Psych:  Responds to questions appropriately  with a normal affect.   Labs: Meds ordered this encounter  Medications  . aspirin EC 81 MG tablet    Sig: Take 81 mg by mouth daily.     ASSESSMENT AND PLAN:  57 y.o. year old male with    Signed, Elvina Sidle, MD 02/13/2013 1:32 PM

## 2013-02-14 LAB — PSA, MEDICARE: PSA: 1.06 ng/mL (ref <=4.00)

## 2013-03-05 ENCOUNTER — Encounter: Payer: Self-pay | Admitting: Internal Medicine

## 2013-05-06 ENCOUNTER — Ambulatory Visit (AMBULATORY_SURGERY_CENTER): Payer: Self-pay

## 2013-05-06 VITALS — Ht 67.0 in | Wt 207.0 lb

## 2013-05-06 DIAGNOSIS — Z1211 Encounter for screening for malignant neoplasm of colon: Secondary | ICD-10-CM

## 2013-05-06 MED ORDER — SUPREP BOWEL PREP KIT 17.5-3.13-1.6 GM/177ML PO SOLN: 1.0000 | Freq: Once | ORAL | Status: DC

## 2013-05-07 ENCOUNTER — Encounter: Payer: Self-pay | Admitting: Internal Medicine

## 2013-05-13 ENCOUNTER — Emergency Department (HOSPITAL_COMMUNITY): Payer: Medicare Other

## 2013-05-13 ENCOUNTER — Inpatient Hospital Stay (HOSPITAL_COMMUNITY)
Admission: EM | Admit: 2013-05-13 | Discharge: 2013-05-15 | DRG: 248 | Disposition: A | Discharge status: Home / Self Care | Payer: Medicare Other | Attending: Internal Medicine | Admitting: Internal Medicine

## 2013-05-13 ENCOUNTER — Encounter (HOSPITAL_COMMUNITY): Payer: Self-pay | Admitting: Emergency Medicine

## 2013-05-13 DIAGNOSIS — R0789 Other chest pain: Secondary | ICD-10-CM

## 2013-05-13 DIAGNOSIS — Z8249 Family history of ischemic heart disease and other diseases of the circulatory system: Secondary | ICD-10-CM

## 2013-05-13 DIAGNOSIS — I10 Essential (primary) hypertension: Secondary | ICD-10-CM | POA: Diagnosis present

## 2013-05-13 DIAGNOSIS — Z6834 Body mass index (BMI) 34.0-34.9, adult: Secondary | ICD-10-CM

## 2013-05-13 DIAGNOSIS — E785 Hyperlipidemia, unspecified: Secondary | ICD-10-CM | POA: Diagnosis present

## 2013-05-13 DIAGNOSIS — K625 Hemorrhage of anus and rectum: Secondary | ICD-10-CM

## 2013-05-13 DIAGNOSIS — F17200 Nicotine dependence, unspecified, uncomplicated: Secondary | ICD-10-CM | POA: Diagnosis present

## 2013-05-13 DIAGNOSIS — Z96659 Presence of unspecified artificial knee joint: Secondary | ICD-10-CM

## 2013-05-13 DIAGNOSIS — I2510 Atherosclerotic heart disease of native coronary artery without angina pectoris: Principal | ICD-10-CM | POA: Diagnosis present

## 2013-05-13 DIAGNOSIS — E7800 Pure hypercholesterolemia, unspecified: Secondary | ICD-10-CM | POA: Diagnosis present

## 2013-05-13 DIAGNOSIS — I200 Unstable angina: Secondary | ICD-10-CM

## 2013-05-13 DIAGNOSIS — F3289 Other specified depressive episodes: Secondary | ICD-10-CM | POA: Diagnosis present

## 2013-05-13 DIAGNOSIS — E669 Obesity, unspecified: Secondary | ICD-10-CM | POA: Diagnosis present

## 2013-05-13 DIAGNOSIS — Z886 Allergy status to analgesic agent status: Secondary | ICD-10-CM

## 2013-05-13 DIAGNOSIS — I2542 Coronary artery dissection: Secondary | ICD-10-CM | POA: Diagnosis present

## 2013-05-13 DIAGNOSIS — K219 Gastro-esophageal reflux disease without esophagitis: Secondary | ICD-10-CM | POA: Diagnosis present

## 2013-05-13 DIAGNOSIS — G894 Chronic pain syndrome: Secondary | ICD-10-CM | POA: Diagnosis present

## 2013-05-13 DIAGNOSIS — M1610 Unilateral primary osteoarthritis, unspecified hip: Secondary | ICD-10-CM | POA: Diagnosis present

## 2013-05-13 DIAGNOSIS — Z79899 Other long term (current) drug therapy: Secondary | ICD-10-CM

## 2013-05-13 DIAGNOSIS — M545 Low back pain, unspecified: Secondary | ICD-10-CM | POA: Diagnosis present

## 2013-05-13 DIAGNOSIS — M169 Osteoarthritis of hip, unspecified: Secondary | ICD-10-CM | POA: Diagnosis present

## 2013-05-13 DIAGNOSIS — I4510 Unspecified right bundle-branch block: Secondary | ICD-10-CM | POA: Diagnosis present

## 2013-05-13 DIAGNOSIS — F411 Generalized anxiety disorder: Secondary | ICD-10-CM | POA: Diagnosis present

## 2013-05-13 DIAGNOSIS — R079 Chest pain, unspecified: Secondary | ICD-10-CM | POA: Insufficient documentation

## 2013-05-13 DIAGNOSIS — M549 Dorsalgia, unspecified: Secondary | ICD-10-CM | POA: Diagnosis present

## 2013-05-13 DIAGNOSIS — Z7982 Long term (current) use of aspirin: Secondary | ICD-10-CM

## 2013-05-13 DIAGNOSIS — M810 Age-related osteoporosis without current pathological fracture: Secondary | ICD-10-CM | POA: Diagnosis present

## 2013-05-13 DIAGNOSIS — Z833 Family history of diabetes mellitus: Secondary | ICD-10-CM

## 2013-05-13 DIAGNOSIS — Z96649 Presence of unspecified artificial hip joint: Secondary | ICD-10-CM

## 2013-05-13 DIAGNOSIS — F329 Major depressive disorder, single episode, unspecified: Secondary | ICD-10-CM | POA: Diagnosis present

## 2013-05-13 HISTORY — DX: Atherosclerotic heart disease of native coronary artery without angina pectoris: I25.10

## 2013-05-13 LAB — BASIC METABOLIC PANEL
BUN: 12 mg/dL (ref 6–23)
CHLORIDE: 102 meq/L (ref 96–112)
CO2: 24 mEq/L (ref 19–32)
CREATININE: 0.86 mg/dL (ref 0.50–1.35)
Calcium: 9.6 mg/dL (ref 8.4–10.5)
GFR calc non Af Amer: >90 mL/min (ref >90)
GLUCOSE: 161 mg/dL — AB (ref 70–99)
POTASSIUM: 3.8 meq/L (ref 3.7–5.3)
Sodium: 140 mEq/L (ref 137–147)

## 2013-05-13 LAB — POCT I-STAT TROPONIN I: Troponin i, poc: 0.01 ng/mL (ref 0.00–0.08)

## 2013-05-13 LAB — CBC
HEMATOCRIT: 43.6 % (ref 39.0–52.0)
HEMOGLOBIN: 15.3 g/dL (ref 13.0–17.0)
MCH: 31.5 pg (ref 26.0–34.0)
MCHC: 35.1 g/dL (ref 30.0–36.0)
MCV: 89.9 fL (ref 78.0–100.0)
Platelets: 251 10*3/uL (ref 150–400)
RBC: 4.85 MIL/uL (ref 4.22–5.81)
RDW: 13.2 % (ref 11.5–15.5)
WBC: 7.6 10*3/uL (ref 4.0–10.5)

## 2013-05-13 LAB — PRO B NATRIURETIC PEPTIDE: Pro B Natriuretic peptide (BNP): 16.9 pg/mL (ref 0–125)

## 2013-05-13 MED ORDER — NITROGLYCERIN 0.4 MG SL SUBL
0.4000 mg | SUBLINGUAL_TABLET | SUBLINGUAL | Status: DC | PRN
  Administered 2013-05-14: 0.4 mg via SUBLINGUAL

## 2013-05-13 MED ORDER — ASPIRIN 81 MG PO CHEW
324.0000 mg | CHEWABLE_TABLET | Freq: Once | ORAL | Status: AC
  Administered 2013-05-13: 324 mg via ORAL
  Filled 2013-05-13: qty 4

## 2013-05-13 MED ORDER — ONDANSETRON HCL 4 MG/2ML IJ SOLN: 4.0000 mg | Freq: Three times a day (TID) | INTRAMUSCULAR | Status: DC | PRN

## 2013-05-13 NOTE — ED Notes (Signed)
Pt. reports progressing mid chest pain with slight SOB and diaphoresis for 1 week .

## 2013-05-13 NOTE — ED Provider Notes (Signed)
CSN: [id number]     Arrival date & time 05/13/13  1939 History   First MD Initiated Contact with Patient 05/13/13 2258     Chief Complaint  Patient presents with  . Chest Pain   (Consider location/radiation/quality/duration/timing/severity/associated sxs/prior Treatment) HPI Comments: 58 yo male with chronic back pain, no recent surgeries, chol/ HTN/ Smoking presents with chest pressure at rest intermittent for 2 wks, gradually lasts longer to 10 min, not specifically with exertion however pt has been getting diaphoretic recently.  Mild chest pressure in ED, non radiating.  No sob.  No recent surgery, travel, leg pain/ swelling or blood clot hx or active CA.  No recent stress test or known CAD.     Patient is a 58 y.o. male presenting with chest pain. The history is provided by the patient.  Chest Pain Associated symptoms: back pain (chronic), diaphoresis and nausea   Associated symptoms: no abdominal pain, no fever, no headache, no shortness of breath and not vomiting     Past Medical History  Diagnosis Date  . Hyperlipidemia   . Chronic pain syndrome     Knees to feet and low back, on Neurontin.  Marland Kitchen Hypertension   . Allergy   . Osteoporosis   . Arthritis   . Depression    Past Surgical History  Procedure Laterality Date  . Spine surgery      Has had surgery spine and low back  . Joint replacement      Left total knee arthroplasty  . Back surgery      8 different times  . Hemorrhoid surgery    . Total hip arthroplasty  05/04/2012    Procedure: TOTAL HIP ARTHROPLASTY;  Surgeon: Gearlean Alf, MD;  Location: [facility];  Service: Orthopedics;  Laterality: Left;   Family History  Problem Relation Age of Onset  . Cancer Maternal Grandmother   . Diabetes Maternal Grandfather   . Heart disease Paternal Grandfather   . Diabetes Mother   . Diabetes Sister   . Colon cancer Neg Hx    History  Substance Use Topics  . Smoking status: Current Every Day Smoker -- 0.33 packs/day for  42 years    Types: Cigarettes  . Smokeless tobacco: Never Used  . Alcohol Use: 0.0 oz/week     Comment: rarely drinks     Review of Systems  Constitutional: Positive for diaphoresis. Negative for fever and chills.  HENT: Negative for congestion.   Eyes: Negative for visual disturbance.  Respiratory: Negative for shortness of breath.   Cardiovascular: Positive for chest pain. Negative for leg swelling.  Gastrointestinal: Positive for nausea. Negative for vomiting and abdominal pain.  Genitourinary: Negative for dysuria and flank pain.  Musculoskeletal: Positive for back pain (chronic). Negative for neck pain and neck stiffness.  Skin: Negative for rash.  Neurological: Negative for light-headedness and headaches.    Allergies  Hydrocodone-acetaminophen  Home Medications   Current Outpatient Rx  Name  Route  Sig  Dispense  Refill  . ACTONEL 35 MG tablet      take 1 tablet by mouth every week   4 tablet   5   . ALPRAZolam (XANAX) 1 MG tablet   Oral   Take 1 tablet (1 mg total) by mouth 3 (three) times daily as needed.   90 tablet   5   . amitriptyline (ELAVIL) 50 MG tablet   Oral   Take 1 tablet (50 mg total) by mouth at bedtime.   [place]  tablet   3   . aspirin EC 81 MG tablet   Oral   Take 81 mg by mouth daily.         . baclofen (LIORESAL) 10 MG tablet      1/2 to 1 tid prn muscle spasm   90 tablet   5   . docusate sodium 100 MG CAPS   Oral   Take 100 mg by mouth 2 (two) times daily.   60 capsule   0   . gabapentin (NEURONTIN) 300 MG capsule   Oral   Take 1-2 capsules (300-600 mg total) by mouth 3 (three) times daily. Takes 2 in the morning 1 at lunch and 2 at night   90 capsule   11   . lisinopril (PRINIVIL,ZESTRIL) 10 MG tablet   Oral   Take 1 tablet (10 mg total) by mouth daily.   90 tablet   0   . omega-3 acid ethyl esters (LOVAZA) 1 G capsule   Oral   Take 2 capsules (2 g total) by mouth 2 (two) times daily.   120 capsule   3   .  Oxycodone-Acetaminophen (ROXICET PO)   Oral   Take by mouth. 10mg 1 qid         . simvastatin (ZOCOR) 40 MG tablet   Oral   Take 1 tablet (40 mg total) by mouth every evening.   90 tablet   3   . SUPREP BOWEL PREP SOLN   Oral   Take 1 kit by mouth once.   354 mL   0     Dispense as written.    BP 139/86  Pulse 100  Temp(Src) 97.9 F (36.6 C) (Oral)  Resp 16  Wt 211 lb 6.4 oz (95.89 kg)  SpO2 96% Physical Exam  Nursing note and vitals reviewed. Constitutional: He is oriented to person, place, and time. He appears well-developed and well-nourished. No distress.  HENT:  Head: Normocephalic and atraumatic.  Eyes: Conjunctivae are normal. Right eye exhibits no discharge. Left eye exhibits no discharge.  Neck: Normal range of motion. Neck supple. No tracheal deviation present.  Cardiovascular: Normal rate, regular rhythm and intact distal pulses.   No murmur heard. Pulmonary/Chest: Effort normal and breath sounds normal.  Abdominal: Soft. He exhibits no distension. There is no tenderness. There is no guarding.  Musculoskeletal: He exhibits no edema and no tenderness.  Neurological: He is alert and oriented to person, place, and time.  Skin: Skin is warm. No rash noted.  Psychiatric: He has a normal mood and affect.    ED Course  Procedures (including critical care time) Labs Review Labs Reviewed  BASIC METABOLIC PANEL - Abnormal; Notable for the following:    Glucose, Bld 161 (*)    All other components within normal limits  CBC  PRO B NATRIURETIC PEPTIDE  TROPONIN I  POCT I-STAT TROPONIN I   Imaging Review Dg Chest 2 View  05/13/2013   CLINICAL DATA:  Chest pain  EXAM: CHEST  2 VIEW  COMPARISON:  Prior radiograph from 10/31/2011  FINDINGS: The cardiac and mediastinal silhouettes are stable in size and contour, and remain within normal limits.  The lungs are normally inflated. No airspace consolidation, pleural effusion, or pulmonary edema is identified. There is  no pneumothorax.  No acute osseous abnormality identified. ACDF overlies the cervical spine and  IMPRESSION: No active cardiopulmonary disease.   Electronically Signed   By: Benjamin  McClintock M.D.   On: 05/13/2013 20:57      EKG Interpretation    Date/Time:  Monday May 13 2013 19:52:42 EST Ventricular Rate:  101 PR Interval:  136 QRS Duration: 96 QT Interval:  332 QTC Calculation: 430 R Axis:   -103 Text Interpretation:  Sinus tachycardia Possible Left atrial enlargement Right superior axis deviation Pulmonary disease pattern Incomplete right bundle branch block Confirmed by ZAVITZ  MD, JOSHUA ([id number]) on 05/13/2013 11:03:59 PM            MDM   1. Acute chest pain   2. HTN (hypertension)    HEART score 5. No risks for PE/ DVT, very low suspicion, no ddimer indicated at this time.  Concern for risk factors, worsening sxs and diaphoresis.   ASA/ Nitro. Chest pressure very mild. Paged Family Practice, that clinic is now unassigned, paged unassigned.   The patients results and plan were reviewed and discussed.   Any x-rays performed were personally reviewed by myself.   Differential diagnosis were considered with the presenting HPI.  Diagnosis: Acute chest pain, HTN  EKG: reveiwed  Admission/ observation were discussed with the admitting physician Dr Vega, patient and/or family and they are comfortable with the plan.      Joshua M Zavitz, MD 05/13/13 2358 

## 2013-05-14 ENCOUNTER — Encounter (HOSPITAL_COMMUNITY): Payer: Self-pay | Admitting: General Practice

## 2013-05-14 ENCOUNTER — Encounter (HOSPITAL_COMMUNITY)
Admission: EM | Disposition: A | Discharge status: Home / Self Care | Payer: Self-pay | Source: Home / Self Care | Attending: Internal Medicine

## 2013-05-14 ENCOUNTER — Telehealth: Payer: Self-pay | Admitting: Internal Medicine

## 2013-05-14 ENCOUNTER — Observation Stay (HOSPITAL_COMMUNITY): Payer: Medicare Other

## 2013-05-14 DIAGNOSIS — R0789 Other chest pain: Secondary | ICD-10-CM | POA: Insufficient documentation

## 2013-05-14 DIAGNOSIS — I200 Unstable angina: Secondary | ICD-10-CM | POA: Insufficient documentation

## 2013-05-14 DIAGNOSIS — F411 Generalized anxiety disorder: Secondary | ICD-10-CM

## 2013-05-14 DIAGNOSIS — R079 Chest pain, unspecified: Secondary | ICD-10-CM

## 2013-05-14 DIAGNOSIS — K219 Gastro-esophageal reflux disease without esophagitis: Secondary | ICD-10-CM

## 2013-05-14 DIAGNOSIS — I2510 Atherosclerotic heart disease of native coronary artery without angina pectoris: Principal | ICD-10-CM

## 2013-05-14 DIAGNOSIS — G894 Chronic pain syndrome: Secondary | ICD-10-CM

## 2013-05-14 HISTORY — PX: LEFT HEART CATHETERIZATION WITH CORONARY ANGIOGRAM: SHX5451

## 2013-05-14 LAB — CBC
HEMATOCRIT: 41 % (ref 39.0–52.0)
HEMOGLOBIN: 14.4 g/dL (ref 13.0–17.0)
MCH: 32 pg (ref 26.0–34.0)
MCHC: 35.1 g/dL (ref 30.0–36.0)
MCV: 91.1 fL (ref 78.0–100.0)
Platelets: 257 10*3/uL (ref 150–400)
RBC: 4.5 MIL/uL (ref 4.22–5.81)
RDW: 13.3 % (ref 11.5–15.5)
WBC: 6.7 10*3/uL (ref 4.0–10.5)

## 2013-05-14 LAB — CREATININE, SERUM
Creatinine, Ser: 0.91 mg/dL (ref 0.50–1.35)
GFR calc Af Amer: >90 mL/min (ref >90)
GFR calc non Af Amer: >90 mL/min (ref >90)

## 2013-05-14 LAB — POCT ACTIVATED CLOTTING TIME: Activated Clotting Time: 260 seconds

## 2013-05-14 LAB — PLATELET COUNT: PLATELETS: 210 10*3/uL (ref 150–400)

## 2013-05-14 LAB — TROPONIN I
Troponin I: <0.3 ng/mL (ref <0.30)
Troponin I: <0.3 ng/mL (ref <0.30)

## 2013-05-14 SURGERY — LEFT HEART CATHETERIZATION WITH CORONARY ANGIOGRAM
Anesthesia: LOCAL

## 2013-05-14 MED ORDER — METOPROLOL TARTRATE 1 MG/ML IV SOLN
INTRAVENOUS | Status: AC
  Filled 2013-05-14: qty 5

## 2013-05-14 MED ORDER — SODIUM CHLORIDE 0.9 % IJ SOLN: 3.0000 mL | INTRAMUSCULAR | Status: DC | PRN

## 2013-05-14 MED ORDER — TIROFIBAN HCL IV 5 MG/100ML
0.1500 ug/kg/min | INTRAVENOUS | Status: AC
  Administered 2013-05-14: 21:00:00 0.15 ug/kg/min via INTRAVENOUS
  Filled 2013-05-14 (×2): qty 100

## 2013-05-14 MED ORDER — ALPRAZOLAM 0.5 MG PO TABS
1.0000 mg | ORAL_TABLET | Freq: Three times a day (TID) | ORAL | Status: DC | PRN
  Administered 2013-05-14: 1 mg via ORAL
  Filled 2013-05-14: qty 2

## 2013-05-14 MED ORDER — CLOPIDOGREL BISULFATE 300 MG PO TABS
ORAL_TABLET | ORAL | Status: AC
Start: 2013-05-14 — End: 2013-05-14
  Filled 2013-05-14: qty 1

## 2013-05-14 MED ORDER — IOHEXOL 350 MG/ML SOLN
80.0000 mL | Freq: Once | INTRAVENOUS | Status: AC | PRN
  Administered 2013-05-14: 80 mL via INTRAVENOUS

## 2013-05-14 MED ORDER — ACETAMINOPHEN 325 MG PO TABS: 650.0000 mg | ORAL_TABLET | ORAL | Status: DC | PRN

## 2013-05-14 MED ORDER — NITROGLYCERIN 0.2 MG/ML ON CALL CATH LAB
INTRAVENOUS | Status: AC
  Filled 2013-05-14: qty 1

## 2013-05-14 MED ORDER — GABAPENTIN 300 MG PO CAPS
600.0000 mg | ORAL_CAPSULE | Freq: Two times a day (BID) | ORAL | Status: DC
  Administered 2013-05-14 (×3): 600 mg via ORAL
  Filled 2013-05-14 (×7): qty 2

## 2013-05-14 MED ORDER — VERAPAMIL HCL 2.5 MG/ML IV SOLN
INTRAVENOUS | Status: AC
  Filled 2013-05-14: qty 2

## 2013-05-14 MED ORDER — MORPHINE SULFATE 2 MG/ML IJ SOLN
2.0000 mg | INTRAMUSCULAR | Status: DC | PRN
  Administered 2013-05-14 (×3): 2 mg via INTRAVENOUS
  Filled 2013-05-14 (×3): qty 1

## 2013-05-14 MED ORDER — ONDANSETRON HCL 4 MG/2ML IJ SOLN: 4.0000 mg | Freq: Four times a day (QID) | INTRAMUSCULAR | Status: DC | PRN

## 2013-05-14 MED ORDER — CLOPIDOGREL BISULFATE 300 MG PO TABS
ORAL_TABLET | ORAL | Status: AC
  Filled 2013-05-14: qty 1

## 2013-05-14 MED ORDER — SODIUM CHLORIDE 0.9 % IV SOLN: 1.0000 mL/kg/h | INTRAVENOUS | Status: DC

## 2013-05-14 MED ORDER — OMEGA-3-ACID ETHYL ESTERS 1 G PO CAPS
2.0000 g | ORAL_CAPSULE | Freq: Two times a day (BID) | ORAL | Status: DC
  Administered 2013-05-14 – 2013-05-15 (×4): 2 g via ORAL
  Filled 2013-05-14 (×6): qty 2

## 2013-05-14 MED ORDER — GABAPENTIN 300 MG PO CAPS: 300.0000 mg | ORAL_CAPSULE | Freq: Three times a day (TID) | ORAL | Status: DC

## 2013-05-14 MED ORDER — NICOTINE 14 MG/24HR TD PT24
14.0000 mg | MEDICATED_PATCH | Freq: Every day | TRANSDERMAL | Status: DC
  Administered 2013-05-14 – 2013-05-15 (×2): 14 mg via TRANSDERMAL
  Filled 2013-05-14 (×2): qty 1

## 2013-05-14 MED ORDER — SODIUM CHLORIDE 0.9 % IJ SOLN: 3.0000 mL | Freq: Two times a day (BID) | INTRAMUSCULAR | Status: DC

## 2013-05-14 MED ORDER — HEPARIN SODIUM (PORCINE) 5000 UNIT/ML IJ SOLN
5000.0000 [IU] | Freq: Three times a day (TID) | INTRAMUSCULAR | Status: DC
  Filled 2013-05-14 (×4): qty 1

## 2013-05-14 MED ORDER — OXYCODONE-ACETAMINOPHEN 5-325 MG PO TABS
2.0000 | ORAL_TABLET | Freq: Four times a day (QID) | ORAL | Status: DC | PRN
  Administered 2013-05-14 (×2): 2 via ORAL
  Filled 2013-05-14 (×2): qty 2

## 2013-05-14 MED ORDER — AMITRIPTYLINE HCL 50 MG PO TABS
50.0000 mg | ORAL_TABLET | Freq: Every day | ORAL | Status: DC
  Administered 2013-05-14 (×2): 50 mg via ORAL
  Filled 2013-05-14 (×4): qty 1

## 2013-05-14 MED ORDER — DIAZEPAM 5 MG PO TABS
5.0000 mg | ORAL_TABLET | ORAL | Status: DC
Start: 2013-05-14 — End: 2013-05-14

## 2013-05-14 MED ORDER — CLOPIDOGREL BISULFATE 75 MG PO TABS
75.0000 mg | ORAL_TABLET | Freq: Every day | ORAL | Status: DC
  Administered 2013-05-15: 09:00:00 75 mg via ORAL
  Filled 2013-05-14: qty 1

## 2013-05-14 MED ORDER — SODIUM CHLORIDE 0.9 % IV SOLN: 250.0000 mL | INTRAVENOUS | Status: DC | PRN

## 2013-05-14 MED ORDER — ASPIRIN EC 325 MG PO TBEC
325.0000 mg | DELAYED_RELEASE_TABLET | Freq: Every day | ORAL | Status: DC
  Administered 2013-05-15: 09:00:00 325 mg via ORAL
  Filled 2013-05-14: qty 1

## 2013-05-14 MED ORDER — LISINOPRIL 10 MG PO TABS
10.0000 mg | ORAL_TABLET | Freq: Every day | ORAL | Status: DC
  Administered 2013-05-14 – 2013-05-15 (×3): 10 mg via ORAL
  Filled 2013-05-14 (×3): qty 1

## 2013-05-14 MED ORDER — SODIUM CHLORIDE 0.9 % IV SOLN
INTRAVENOUS | Status: DC
  Administered 2013-05-14 – 2013-05-15 (×2): via INTRAVENOUS

## 2013-05-14 MED ORDER — HEPARIN (PORCINE) IN NACL 2-0.9 UNIT/ML-% IJ SOLN
INTRAMUSCULAR | Status: AC
  Filled 2013-05-14: qty 1000

## 2013-05-14 MED ORDER — SODIUM CHLORIDE 0.9 % IV SOLN
INTRAVENOUS | Status: DC
  Administered 2013-05-14: 11:00:00 via INTRAVENOUS

## 2013-05-14 MED ORDER — NITROGLYCERIN 0.4 MG SL SUBL: 0.4000 mg | SUBLINGUAL_TABLET | SUBLINGUAL | Status: DC | PRN

## 2013-05-14 MED ORDER — ASPIRIN EC 325 MG PO TBEC
325.0000 mg | DELAYED_RELEASE_TABLET | Freq: Every day | ORAL | Status: DC
  Administered 2013-05-14: 325 mg via ORAL
  Filled 2013-05-14: qty 1

## 2013-05-14 MED ORDER — HEPARIN SODIUM (PORCINE) 5000 UNIT/ML IJ SOLN
5000.0000 [IU] | Freq: Three times a day (TID) | INTRAMUSCULAR | Status: DC
  Administered 2013-05-14: 5000 [IU] via SUBCUTANEOUS
  Filled 2013-05-14 (×4): qty 1

## 2013-05-14 MED ORDER — METOPROLOL TARTRATE 50 MG PO TABS
50.0000 mg | ORAL_TABLET | Freq: Once | ORAL | Status: AC
  Administered 2013-05-14: 50 mg via ORAL
  Filled 2013-05-14: qty 1

## 2013-05-14 MED ORDER — DOCUSATE SODIUM 100 MG PO CAPS
100.0000 mg | ORAL_CAPSULE | Freq: Two times a day (BID) | ORAL | Status: DC | PRN
  Filled 2013-05-14 (×2): qty 1

## 2013-05-14 MED ORDER — GABAPENTIN 300 MG PO CAPS
300.0000 mg | ORAL_CAPSULE | Freq: Every day | ORAL | Status: DC
  Administered 2013-05-14 – 2013-05-15 (×2): 300 mg via ORAL
  Filled 2013-05-14 (×2): qty 1

## 2013-05-14 MED ORDER — GI COCKTAIL ~~LOC~~
30.0000 mL | Freq: Four times a day (QID) | ORAL | Status: DC | PRN
  Filled 2013-05-14: qty 30

## 2013-05-14 MED ORDER — HEPARIN SODIUM (PORCINE) 1000 UNIT/ML IJ SOLN
INTRAMUSCULAR | Status: AC
  Filled 2013-05-14: qty 1

## 2013-05-14 MED ORDER — TIROFIBAN HCL IV 5 MG/100ML
INTRAVENOUS | Status: AC
  Filled 2013-05-14: qty 100

## 2013-05-14 MED ORDER — ONDANSETRON HCL 4 MG/2ML IJ SOLN
4.0000 mg | Freq: Four times a day (QID) | INTRAMUSCULAR | Status: DC | PRN
Start: 2013-05-14 — End: 2013-05-15

## 2013-05-14 MED ORDER — SIMVASTATIN 40 MG PO TABS
40.0000 mg | ORAL_TABLET | Freq: Every evening | ORAL | Status: DC
  Administered 2013-05-14 (×2): 40 mg via ORAL
  Filled 2013-05-14 (×3): qty 1

## 2013-05-14 MED ORDER — FENTANYL CITRATE 0.05 MG/ML IJ SOLN
INTRAMUSCULAR | Status: AC
  Filled 2013-05-14: qty 2

## 2013-05-14 MED ORDER — LIDOCAINE HCL (PF) 1 % IJ SOLN
INTRAMUSCULAR | Status: AC
  Filled 2013-05-14: qty 30

## 2013-05-14 MED ORDER — ASPIRIN 81 MG PO CHEW: 324.0000 mg | CHEWABLE_TABLET | ORAL | Status: DC

## 2013-05-14 NOTE — Interval H&P Note (Signed)
Cath Lab Visit (complete for each Cath Lab visit)  Clinical Evaluation Leading to the Procedure:   ACS: yes  Non-ACS:    Anginal Classification: CCS IV  Anti-ischemic medical therapy: 1 medicine  Non-Invasive Test Results: Intermediate-risk stress test findings: cardiac mortality 1-3%/year  Prior CABG: No previous CABG      History and Physical Interval Note:  05/14/2013 3:02 PM  Jeremiah Goodwin  has presented today for surgery, with the diagnosis of cp  The various methods of treatment have been discussed with the patient and family. After consideration of risks, benefits and other options for treatment, the patient has consented to  Procedure(s): LEFT HEART CATHETERIZATION WITH CORONARY ANGIOGRAM (N/A) as a surgical intervention .  The patient's history has been reviewed, patient examined, no change in status, stable for surgery.  I have reviewed the patient's chart and labs.  Questions were answered to the patient's satisfaction.     [name]

## 2013-05-14 NOTE — Progress Notes (Addendum)
Patient and family in the room. Advised them of the abnormal cardiac CT results and the need for cardiac catheterization.The risks and benefits of a cardiac catheterization including, but not limited to, death, stroke, MI, kidney damage and bleeding were discussed with the patient who indicates understanding and agrees to proceed. Orders written, pt on board.    Ct Coronary Morp W/cta Cor W/score W/ca W/cm &/or Wo/cm 05/14/2013   CLINICAL DATA:  Chest pain  EXAM: Cardiac CTA  MEDICATIONS: Sub lingual nitro. 4mg  and lopressor 50mg  PO and 5mg  iv at scanner  TECHNIQUE: The patient was scanned on a Philips 161 slice scanner. Gantry rotation speed was 270 msecs. Collimation was .70mm. A 100 kV prospective scan was triggered in the descending thoracic aorta at 111 HU's with 5% padding centered around 78% of the R-R interval. Average HR during the scan was 58 bpm. The 3D data set was interpreted on a dedicated work station using MPR, MIP and VRT modes. A total of 80cc of contrast was used.  FINDINGS: Non-cardiac: See separate report from [facility]. No significant findings on limited lung and soft tissue windows.  Calcium Score:  344 mostly in the LAD but present in all 3 vessels  Coronary Arteries: Right dominant with no anomalies  LM:  Less than 30% calcific disease  LAD: Complex mixed plaque in the proximal vessel 50% or greater stenosis. Mid vessel 50% or less stenosis Distal 30% mixed plaque  D1:  Small vessel normal  D2:  Large vessel less than 50% calcific plaque ostium  D3:  Small and normal  D4:  Small and normal  Circumflex:  Less than 30% disease proximally  OM1:  Small vessel normal  OM2: Large vessel normal  OM3:  Normal  OM4:  Normal  RCA: Dominant Less than 50% mixed plaque in the proximal and mid vessel.  PDA:  Normal  PLA:  Normal  IMPRESSION: 1) Very high calcium score 344 84th percentile for age and sex matched controls  2) Non obstructive mixed plaque in the proximal and mid RCA. Cannot  exclude >50% mixed plaque in the proximal LAD Patient will be referred for  Cath given mixed nature of plaque, location and presentation with chest pain  3) Normal ascending aorta 3.6 cm  Jenkins Rouge   Electronically Signed   By: Jenkins Rouge M.D.   On: 05/14/2013 12:52

## 2013-05-14 NOTE — Consult Note (Signed)
CARDIOLOGY CONSULT NOTE   Patient ID: Jeremiah Goodwin MRN: [id number] DOB/AGE: 58-Feb-1957 58 y.o.  Admit date: 05/13/2013  Primary Physician   [facility]   new Reason for Consultation   Chest pain  Jeremiah Goodwin is a 58 y.o. male with no history of CAD.  He has been having intermittent chest pain x a few weeks. It is sharp and a pressure. The episodes resolve in 5-10 minutes. He feels pressing on his chest helps a little.   He has worsening symptoms and came to the hospital on 01/19. His pain has been up to an 8/10, but is currently a 3 or 4/10. His symptoms have responded to morphine, but not NTG. His enzymes have been negative, but his ECG this am showed a developing RBBB. He is having ongoing pain, so is being evaluated.    Past Medical History  Diagnosis Date  . Hyperlipidemia   . Chronic pain syndrome     Knees to feet and low back, on Neurontin.  Marland Kitchen Hypertension   . Allergy   . Osteoporosis   . Arthritis   . Depression      Past Surgical History  Procedure Laterality Date  . Spine surgery      Has had surgery spine and low back  . Joint replacement      Left total knee arthroplasty  . Back surgery      8 different times  . Hemorrhoid surgery    . Total hip arthroplasty  05/04/2012    Procedure: TOTAL HIP ARTHROPLASTY;  Surgeon: Gearlean Alf, MD;  Location: [facility];  Service: Orthopedics;  Laterality: Left;    Allergies  Allergen Reactions  . Hydrocodone-Acetaminophen Itching    REACTION: itching    I have reviewed the patient's current medications . amitriptyline  50 mg Oral QHS  . aspirin EC  325 mg Oral Daily  . gabapentin  300 mg Oral Q lunch  . gabapentin  600 mg Oral BID  . heparin  5,000 Units Subcutaneous Q8H  . lisinopril  10 mg Oral Daily  . metoprolol tartrate  50 mg Oral Once  . nicotine  14 mg Transdermal Daily  . omega-3 acid ethyl esters  2 g Oral BID  . simvastatin  40 mg Oral QPM     acetaminophen,  ALPRAZolam, docusate sodium, gi cocktail, morphine injection, nitroGLYCERIN, ondansetron (ZOFRAN) IV  Prior to Admission medications   Medication Sig Start Date End Date Taking? Authorizing Provider  ACTONEL 35 MG tablet take 1 tablet by mouth every week 11/21/12  Yes [name], MD  ALPRAZolam Duanne Moron) 1 MG tablet Take 1 tablet (1 mg total) by mouth 3 (three) times daily as needed. 02/13/13  Yes [name], MD  amitriptyline (ELAVIL) 50 MG tablet Take 1 tablet (50 mg total) by mouth at bedtime. 02/13/13  Yes [name], MD  aspirin EC 81 MG tablet Take 81 mg by mouth daily.   Yes Historical Provider, MD  baclofen (LIORESAL) 10 MG tablet 1/2 to 1 tid prn muscle spasm 09/30/12  Yes [name], MD  docusate sodium 100 MG CAPS Take 100 mg by mouth 2 (two) times daily. 05/06/12  Yes Alexzandrew Dara Lords, PA-C  gabapentin (NEURONTIN) 300 MG capsule Take 1-2 capsules (300-600 mg total) by mouth 3 (three) times daily. Takes 2 in the morning 1 at lunch and 2 at night 09/30/12  Yes [name], MD  lisinopril (PRINIVIL,ZESTRIL) 10 MG tablet Take 1 tablet (  10 mg total) by mouth daily. 02/13/13  Yes [name], MD  omega-3 acid ethyl esters (LOVAZA) 1 G capsule Take 2 capsules (2 g total) by mouth 2 (two) times daily. 10/05/12  Yes [name], PA-C  Oxycodone-Acetaminophen (ROXICET PO) Take by mouth. 58m 1 qid   Yes Historical Provider, MD  simvastatin (ZOCOR) 40 MG tablet Take 1 tablet (40 mg total) by mouth every evening. 09/30/12  Yes KRobyn Haber MD  SUPREP BOWEL PREP SOLN Take 1 kit by mouth once. 05/06/13  Yes CGatha Mayer MD     History   Social History  . Marital Status: Divorced    Spouse Name: N/A    Number of Children: N/A  . Years of Education: N/A   Occupational History  . Unemployed    Social History Main Topics  . Smoking status: Current Every Day Smoker -- 0.33 packs/day for 42 years    Types: Cigarettes  . Smokeless tobacco: Never Used  . Alcohol  Use: 0.0 oz/week     Comment: rarely drinks   . Drug Use: No  . Sexual Activity: No   Other Topics Concern  . Not on file   Social History Narrative   [place]    Family Status  Relation Status Death Age  . Maternal Grandmother Deceased   . Maternal Grandfather Deceased   . Paternal Grandfather Deceased   . Mother Deceased   . Sister Alive   . Father Alive   . Paternal Grandmother Deceased    Family History  Problem Relation Age of Onset  . Cancer Maternal Grandmother   . Diabetes Maternal Grandfather   . Heart disease Paternal Grandfather   . Diabetes Mother   . Diabetes Sister   . Colon cancer Neg Hx      ROS: Chronic MS issues. No fevers or chills  Full 14 point review of systems complete and found to be negative unless listed above.  Physical Exam: Blood pressure 124/74, pulse 86, temperature 98.2 F (36.8 C), temperature source Oral, resp. rate 18, height '5\' 7"'  (1.702 m), weight 217 lb 4.8 oz (98.567 kg), SpO2 97.00%.  General: Well developed, well nourished, male in mild distress Head: Eyes PERRLA, No xanthomas.   Normocephalic and atraumatic, oropharynx without edema or exudate. Dentition: good Lungs: CTA Heart: HRRR S1 S2, no rub/gallop, no murmur. pulses are 2+ all 4 extrem.   Neck: No carotid bruits. No lymphadenopathy.  JVD not elevated. Abdomen: Bowel sounds present, abdomen soft and non-tender without masses or hernias noted. Msk:  No spine or cva tenderness. No weakness, no joint deformities or effusions. Extremities: No clubbing or cyanosis. no edema.  Neuro: Alert and oriented X 3. No focal deficits noted. Psych:  Good affect, responds appropriately Skin: No rashes or lesions noted.  Labs:   Lab Results  Component Value Date   WBC 6.7 05/14/2013   HGB 14.4 05/14/2013   HCT 41.0 05/14/2013   MCV 91.1 05/14/2013   PLT 257 05/14/2013     Recent Labs Lab 05/13/13 1956 05/14/13 0145  NA 140  --   K 3.8  --   CL 102  --   CO2 24  --     BUN 12  --   CREATININE 0.86 0.91  CALCIUM 9.6  --   GLUCOSE 161*  --     Recent Labs  05/13/13 2350 05/14/13 0145 05/14/13 0702  TROPONINI <0.30 <0.30 <0.30    Recent Labs  05/13/13 2016  TROPIPOC 0.01   Pro B Natriuretic peptide (BNP)  Date/Time Value Range Status  05/13/2013  7:56 PM 16.9  0 - 125 pg/mL Final   Lab Results  Component Value Date   CHOL 150 02/13/2013   HDL 60 02/13/2013   LDLCALC 79 02/13/2013   TRIG 57 02/13/2013   ECG: SR, RBBB, different from previous ECG  Radiology:  Dg Chest 2 View 05/13/2013   CLINICAL DATA:  Chest pain  EXAM: CHEST  2 VIEW  COMPARISON:  Prior radiograph from 10/31/2011  FINDINGS: The cardiac and mediastinal silhouettes are stable in size and contour, and remain within normal limits.  The lungs are normally inflated. No airspace consolidation, pleural effusion, or pulmonary edema is identified. There is no pneumothorax.  No acute osseous abnormality identified. ACDF overlies the cervical spine and  IMPRESSION: No active cardiopulmonary disease.   Electronically Signed   By: Jeannine Boga M.D.   On: 05/13/2013 20:57    ASSESSMENT AND PLAN:   The patient was seen today by Dr. Johnsie Cancel, the patient evaluated and the data reviewed.     Acute chest pain - multiple cardiac risk factors, ongoing pain and ECG slightly different from previous. Will schedule a cardiac CT, give Lopressor 50 mg x 1 for better HR control, change IV to 18 gauge and will give IVF at 50 cc/hr since pt NPO.   Otherwise, per primary MD. Active Problems:   ANXIETY   TOBACCO ABUSE   GASTROESOPHAGEAL REFLUX DISEASE   BACK PAIN, CHRONIC   HTN (hypertension)   OA (osteoarthritis) of hip   Chest discomfort   Signed: Rosaria Ferries, PA-C 05/14/2013 9:47 AM Jeremiah Goodwin [phone]  Co-Sign MD  Patient examined chart reviewed  ECG with J point elevation no ST elevation Pain has some atypical features  No pleuritic or pericardial rub Discussed with patient Cardiac  CT betst test to risk stratify and assess aorta, cors and proximal pulmonary arteries  Given 55m lopressor And orders written  Continue ACE for BP  Chronic back pain seems under reasonable control but may be having referred pain.    PJenkins Rouge

## 2013-05-14 NOTE — Plan of Care (Signed)
Problem: Phase I Progression Outcomes Goal: Aspirin unless contraindicated Outcome: Completed/Met Date Met:  05/14/13 324 mg of ASA given in the ED     

## 2013-05-14 NOTE — Progress Notes (Signed)
  Pharmacy Consult for Aggrastat  Indication: chest pain/ACS, s/p cath  Allergies  Allergen Reactions  . Hydrocodone-Acetaminophen Itching    REACTION: itching    Labs:  Recent Labs  05/13/13 1956  05/14/13 0145 05/14/13 0702 05/14/13 1251  HGB 15.3  --  14.4  --   --   HCT 43.6  --  41.0  --   --   PLT 251  --  257  --   --   CREATININE 0.86  --  0.91  --   --   TROPONINI  --   < > <0.30 <0.30 <0.30  < > = values in this interval not displayed.  Past Medical History  Diagnosis Date  . Hyperlipidemia   . Chronic pain syndrome     Knees to feet and low back, on Neurontin.  Marland Kitchen Hypertension   . Allergy   . Osteoporosis   . Arthritis   . Depression   . Intermediate coronary syndrome     Assessment: 33 YOM with hx of CAD, s/p cath today with BMS placement to the mid right coronary artery. Plan for continue tirofiban 12 hrs post-cath (stop time 0345 confirmed with RN). Wt = 98kg, scr 0.86, est. crcl > 90 ml/min. hgb 14.4, plt 257  Goal of Therapy:   Monitor platelets by anticoagulation protocol: Yes   Plan:  - Continue tirofiban 0.15 mcg/kg/min = 17.7 ml/hr until 4AM - f/u AM CBC  Maryanna Shape, PharmD, BCPS  Clinical Pharmacist  Pager: [phone]   05/14/2013,7:33 PM

## 2013-05-14 NOTE — Telephone Encounter (Signed)
Ok No charge

## 2013-05-14 NOTE — H&P (View-Only) (Signed)
CARDIOLOGY CONSULT NOTE   Patient ID: Jeremiah Goodwin MRN: [id number] DOB/AGE: 07/03/1955 58 y.o.  Admit date: 05/13/2013  Primary Physician   Jeremiah Goodwin Primary Cardiologist   new Reason for Consultation   Chest pain  YKZ:LDJTT Jeremiah Goodwin is a 58 y.o. male with no history of CAD.  He has been having intermittent chest pain x a few weeks. It is sharp and a pressure. The episodes resolve in 5-10 minutes. He feels pressing on his chest helps a little.   He has worsening symptoms and came to the hospital on 01/19. His pain has been up to an 8/10, but is currently a 3 or 4/10. His symptoms have responded to morphine, but not NTG. His enzymes have been negative, but his ECG this am showed a developing RBBB. He is having ongoing pain, so is being evaluated.    Past Medical History  Diagnosis Date  . Hyperlipidemia   . Chronic pain syndrome     Knees to feet and low back, on Neurontin.  Marland Kitchen Hypertension   . Allergy   . Osteoporosis   . Arthritis   . Depression      Past Surgical History  Procedure Laterality Date  . Spine surgery      Has had surgery spine and low back  . Joint replacement      Left total knee arthroplasty  . Back surgery      8 different times  . Hemorrhoid surgery    . Total hip arthroplasty  05/04/2012    Procedure: TOTAL HIP ARTHROPLASTY;  Surgeon: Jeremiah Alf, MD;  Location: [facility];  Service: Orthopedics;  Laterality: Left;    Allergies  Allergen Reactions  . Hydrocodone-Acetaminophen Itching    REACTION: itching    I have reviewed the patient's current medications . amitriptyline  50 mg Oral QHS  . aspirin EC  325 mg Oral Daily  . gabapentin  300 mg Oral Q lunch  . gabapentin  600 mg Oral BID  . heparin  5,000 Units Subcutaneous Q8H  . lisinopril  10 mg Oral Daily  . metoprolol tartrate  50 mg Oral Once  . nicotine  14 mg Transdermal Daily  . omega-3 acid ethyl esters  2 g Oral BID  . simvastatin  40 mg Oral QPM     acetaminophen,  ALPRAZolam, docusate sodium, gi cocktail, morphine injection, nitroGLYCERIN, ondansetron (ZOFRAN) IV  Prior to Admission medications   Medication Sig Start Date End Date Taking? Authorizing Provider  ACTONEL 35 MG tablet take 1 tablet by mouth every week 11/21/12  Yes Jeremiah Haber, MD  ALPRAZolam Duanne Moron) 1 MG tablet Take 1 tablet (1 mg total) by mouth 3 (three) times daily as needed. 02/13/13  Yes Jeremiah Haber, MD  amitriptyline (ELAVIL) 50 MG tablet Take 1 tablet (50 mg total) by mouth at bedtime. 02/13/13  Yes Jeremiah Haber, MD  aspirin EC 81 MG tablet Take 81 mg by mouth daily.   Yes Historical Provider, MD  baclofen (LIORESAL) 10 MG tablet 1/2 to 1 tid prn muscle spasm 09/30/12  Yes Jeremiah Haber, MD  docusate sodium 100 MG CAPS Take 100 mg by mouth 2 (two) times daily. 05/06/12  Yes Jeremiah Dara Lords, PA-C  gabapentin (NEURONTIN) 300 MG capsule Take 1-2 capsules (300-600 mg total) by mouth 3 (three) times daily. Takes 2 in the morning 1 at lunch and 2 at night 09/30/12  Yes Jeremiah Haber, MD  lisinopril (PRINIVIL,ZESTRIL) 10 MG tablet Take 1 tablet (  10 mg total) by mouth daily. 02/13/13  Yes Jeremiah Haber, MD  omega-3 acid ethyl esters (LOVAZA) 1 G capsule Take 2 capsules (2 g total) by mouth 2 (two) times daily. 10/05/12  Yes Jeremiah Sers, PA-C  Oxycodone-Acetaminophen (ROXICET PO) Take by mouth. 74m 1 qid   Yes Historical Provider, MD  simvastatin (ZOCOR) 40 MG tablet Take 1 tablet (40 mg total) by mouth every evening. 09/30/12  Yes Jeremiah Haber MD  SUPREP BOWEL PREP SOLN Take 1 kit by mouth once. 05/06/13  Yes CGatha Mayer MD     History   Social History  . Marital Status: Divorced    Spouse Name: N/A    Number of Children: N/A  . Years of Education: N/A   Occupational History  . Unemployed    Social History Main Topics  . Smoking status: Current Every Day Smoker -- 0.33 packs/day for 42 years    Types: Cigarettes  . Smokeless tobacco: Never Used  . Alcohol  Use: 0.0 oz/week     Comment: rarely drinks   . Drug Use: No  . Sexual Activity: No   Other Topics Concern  . Not on file   Social History Narrative   Lives in [place]    Family Status  Relation Status Death Age  . Maternal Grandmother Deceased   . Maternal Grandfather Deceased   . Paternal Grandfather Deceased   . Mother Deceased   . Sister Alive   . Father Alive   . Paternal Grandmother Deceased    Family History  Problem Relation Age of Onset  . Cancer Maternal Grandmother   . Diabetes Maternal Grandfather   . Heart disease Paternal Grandfather   . Diabetes Mother   . Diabetes Sister   . Colon cancer Neg Hx      ROS: Chronic MS issues. No fevers or chills  Full 14 point review of systems complete and found to be negative unless listed above.  Physical Exam: Blood pressure 124/74, pulse 86, temperature 98.2 F (36.8 C), temperature source Oral, resp. rate 18, height '5\' 7"'  (1.702 m), weight 217 lb 4.8 oz (98.567 kg), SpO2 97.00%.  General: Well developed, well nourished, male in mild distress Head: Eyes PERRLA, No xanthomas.   Normocephalic and atraumatic, oropharynx without edema or exudate. Dentition: good Lungs: CTA Heart: HRRR S1 S2, no rub/gallop, no murmur. pulses are 2+ all 4 extrem.   Neck: No carotid bruits. No lymphadenopathy.  JVD not elevated. Abdomen: Bowel sounds present, abdomen soft and non-tender without masses or hernias noted. Msk:  No spine or cva tenderness. No weakness, no joint deformities or effusions. Extremities: No clubbing or cyanosis. no edema.  Neuro: Alert and oriented X 3. No focal deficits noted. Psych:  Good affect, responds appropriately Skin: No rashes or lesions noted.  Labs:   Lab Results  Component Value Date   WBC 6.7 05/14/2013   HGB 14.4 05/14/2013   HCT 41.0 05/14/2013   MCV 91.1 05/14/2013   PLT 257 05/14/2013     Recent Labs Lab 05/13/13 1956 05/14/13 0145  NA 140  --   K 3.8  --   CL 102  --   CO2 24  --     BUN 12  --   CREATININE 0.86 0.91  CALCIUM 9.6  --   GLUCOSE 161*  --     Recent Labs  05/13/13 2350 05/14/13 0145 05/14/13 0702  TROPONINI <0.30 <0.30 <0.30    Recent Labs  05/13/13 2016  TROPIPOC 0.01   Pro B Natriuretic peptide (BNP)  Date/Time Value Range Status  05/13/2013  7:56 PM 16.9  0 - 125 pg/mL Final   Lab Results  Component Value Date   CHOL 150 02/13/2013   HDL 60 02/13/2013   LDLCALC 79 02/13/2013   TRIG 57 02/13/2013   ECG: SR, RBBB, different from previous ECG  Radiology:  Dg Chest 2 View 05/13/2013   CLINICAL DATA:  Chest pain  EXAM: CHEST  2 VIEW  COMPARISON:  Prior radiograph from 10/31/2011  FINDINGS: The cardiac and mediastinal silhouettes are stable in size and contour, and remain within normal limits.  The lungs are normally inflated. No airspace consolidation, pleural effusion, or pulmonary edema is identified. There is no pneumothorax.  No acute osseous abnormality identified. ACDF overlies the cervical spine and  IMPRESSION: No active cardiopulmonary disease.   Electronically Signed   By: Jeannine Boga M.Jeremiah.   On: 05/13/2013 20:57    ASSESSMENT AND PLAN:   The patient was seen today by Dr. Johnsie Cancel, the patient evaluated and the data reviewed.     Acute chest pain - multiple cardiac risk factors, ongoing pain and ECG slightly different from previous. Will schedule a cardiac CT, give Lopressor 50 mg x 1 for better HR control, change IV to 18 gauge and will give IVF at 50 cc/hr since pt NPO.   Otherwise, per primary MD. Active Problems:   ANXIETY   TOBACCO ABUSE   GASTROESOPHAGEAL REFLUX DISEASE   BACK PAIN, CHRONIC   HTN (hypertension)   OA (osteoarthritis) of hip   Chest discomfort   Signed: Rosaria Ferries, PA-C 05/14/2013 9:47 AM Murlean Hark [phone]  Co-Sign MD  Patient examined chart reviewed  ECG with J point elevation no ST elevation Pain has some atypical features  No pleuritic or pericardial rub Discussed with patient Cardiac  CT betst test to risk stratify and assess aorta, cors and proximal pulmonary arteries  Given 62m lopressor And orders written  Continue ACE for BP  Chronic back pain seems under reasonable control but may be having referred pain.    PJenkins Rouge

## 2013-05-14 NOTE — H&P (Signed)
[facility] History and Physical  Jeremiah Goodwin FKC:[id number] DOB: [birth year] DOA: 05/13/2013  Referring physician: Dr. Reather Converse PCP: Provider Not In System   Chief Complaint: Chest discomfort  HPI: Jeremiah Goodwin is a 58 y.o. male  With history of obesity, GERD, active smoker, hypertension, hyperlipidemia. Who presented with the above complaint. Currently denies any chest discomfort. States that the chest discomfort occurred while he was driving. Reportedly he had eaten 1 hour prior to developing the chest discomfort. The chest discomfort was in the middle of his chest and felt like a pressure sensation which was associated with diaphoresis. He denies any radiation of the discomfort to his arms or jaw. Patient denies any sick contacts, fevers or chills.  In the ED patient had negative troponin and vital signs were within normal limits. White blood cell count was within normal limits and chest x-ray reported no active cardiopulmonary disease. We were consult at for admission evaluation and recommendations given patient's chest discomfort.   Review of Systems:  Constitutional:  No weight loss, night sweats, Fevers, chills, fatigue.  HEENT:  No headaches, Difficulty swallowing,Tooth/dental problems,Sore throat,  No sneezing, itching, ear ache, nasal congestion, post nasal drip,  Cardio-vascular:  Positive chest pain, no Orthopnea, PND, swelling in lower extremities, anasarca, dizziness, palpitations  GI:  No heartburn, indigestion, abdominal pain, nausea, vomiting, diarrhea, change in bowel habits, loss of appetite  Resp:  No shortness of breath with exertion or at rest. No excess mucus, no productive cough, No non-productive cough, No coughing up of blood.No change in color of mucus.No wheezing.No chest wall deformity  Skin:  no rash or lesions.  GU:  no dysuria, change in color of urine, no urgency or frequency. No flank pain.  Musculoskeletal:  No joint pain or swelling. No  decreased range of motion. No back pain.  Psych:  No change in mood or affect. No depression or anxiety. No memory loss.   Past Medical History  Diagnosis Date  . Hyperlipidemia   . Chronic pain syndrome     Knees to feet and low back, on Neurontin.  Marland Kitchen Hypertension   . Allergy   . Osteoporosis   . Arthritis   . Depression    Past Surgical History  Procedure Laterality Date  . Spine surgery      Has had surgery spine and low back  . Joint replacement      Left total knee arthroplasty  . Back surgery      8 different times  . Hemorrhoid surgery    . Total hip arthroplasty  05/04/2012    Procedure: TOTAL HIP ARTHROPLASTY;  Surgeon: Gearlean Alf, MD;  Location: [facility];  Service: Orthopedics;  Laterality: Left;   Social History:  reports that he has been smoking Cigarettes.  He has a 13.86 pack-year smoking history. He has never used smokeless tobacco. He reports that he drinks alcohol. He reports that he does not use illicit drugs.  Allergies  Allergen Reactions  . Hydrocodone-Acetaminophen Itching    REACTION: itching    Family History  Problem Relation Age of Onset  . Cancer Maternal Grandmother   . Diabetes Maternal Grandfather   . Heart disease Paternal Grandfather   . Diabetes Mother   . Diabetes Sister   . Colon cancer Neg Hx      Prior to Admission medications   Medication Sig Start Date End Date Taking? Authorizing Provider  ACTONEL 35 MG tablet take 1 tablet by mouth every week 11/21/12  Yes [name], MD  ALPRAZolam Duanne Moron) 1 MG tablet Take 1 tablet (1 mg total) by mouth 3 (three) times daily as needed. 02/13/13  Yes [name], MD  amitriptyline (ELAVIL) 50 MG tablet Take 1 tablet (50 mg total) by mouth at bedtime. 02/13/13  Yes [name], MD  aspirin EC 81 MG tablet Take 81 mg by mouth daily.   Yes Historical Provider, MD  baclofen (LIORESAL) 10 MG tablet 1/2 to 1 tid prn muscle spasm 09/30/12  Yes [name], MD  docusate sodium 100 MG  CAPS Take 100 mg by mouth 2 (two) times daily. 05/06/12  Yes Alexzandrew Dara Lords, PA-C  gabapentin (NEURONTIN) 300 MG capsule Take 1-2 capsules (300-600 mg total) by mouth 3 (three) times daily. Takes 2 in the morning 1 at lunch and 2 at night 09/30/12  Yes [name], MD  lisinopril (PRINIVIL,ZESTRIL) 10 MG tablet Take 1 tablet (10 mg total) by mouth daily. 02/13/13  Yes [name], MD  omega-3 acid ethyl esters (LOVAZA) 1 G capsule Take 2 capsules (2 g total) by mouth 2 (two) times daily. 10/05/12  Yes [name], PA-C  Oxycodone-Acetaminophen (ROXICET PO) Take by mouth. 32m 1 qid   Yes Historical Provider, MD  simvastatin (ZOCOR) 40 MG tablet Take 1 tablet (40 mg total) by mouth every evening. 09/30/12  Yes KRobyn Haber MD  SUPREP BOWEL PREP SOLN Take 1 kit by mouth once. 05/06/13  Yes CGatha Mayer MD   Physical Exam: Filed Vitals:   05/14/13 0015  BP: 123/73  Pulse: 83  Temp:   Resp: 18    BP 123/73  Pulse 83  Temp(Src) 97.9 F (36.6 C) (Oral)  Resp 18  Wt 95.89 kg (211 lb 6.4 oz)  SpO2 98%  General:  Appears calm and comfortable Eyes: PERRL, normal lids, irises & conjunctiva ENT: grossly normal hearing, lips & tongue Neck: no LAD, masses or thyromegaly Cardiovascular: RRR, no m/r/g. No LE edema. Telemetry: SR, no arrhythmias  Respiratory: CTA bilaterally, no w/r/r. Normal respiratory effort. Abdomen: soft, ntnd Skin: no rash or induration seen on limited exam Musculoskeletal: grossly normal tone BUE/BLE Psychiatric: grossly normal mood and affect, speech fluent and appropriate Neurologic: grossly non-focal.          Labs on Admission:  Basic Metabolic Panel:  Recent Labs Lab 05/13/13 1956  NA 140  K 3.8  CL 102  CO2 24  GLUCOSE 161*  BUN 12  CREATININE 0.86  CALCIUM 9.6   Liver Function Tests: No results found for this basename: AST, ALT, ALKPHOS, BILITOT, PROT, ALBUMIN,  in the last 168 hours No results found for this basename: LIPASE,  AMYLASE,  in the last 168 hours No results found for this basename: AMMONIA,  in the last 168 hours CBC:  Recent Labs Lab 05/13/13 1956  WBC 7.6  HGB 15.3  HCT 43.6  MCV 89.9  PLT 251   Cardiac Enzymes: No results found for this basename: CKTOTAL, CKMB, CKMBINDEX, TROPONINI,  in the last 168 hours  BNP (last 3 results)  Recent Labs  05/13/13 1956  PROBNP 16.9   CBG: No results found for this basename: GLUCAP,  in the last 168 hours  Radiological Exams on Admission: Dg Chest 2 View  05/13/2013   CLINICAL DATA:  Chest pain  EXAM: CHEST  2 VIEW  COMPARISON:  Prior radiograph from 10/31/2011  FINDINGS: The cardiac and mediastinal silhouettes are stable in size and contour, and remain within normal limits.  The  lungs are normally inflated. No airspace consolidation, pleural effusion, or pulmonary edema is identified. There is no pneumothorax.  No acute osseous abnormality identified. ACDF overlies the cervical spine and  IMPRESSION: No active cardiopulmonary disease.   Electronically Signed   By: Jeannine Boga M.D.   On: 05/13/2013 20:57    EKG: Independently reviewed. Sinus rhythm with no ST elevations or depressions  Assessment/Plan Active Problems:   Acute chest pain - Place on telemetry monitoring - Troponins every 6 hours x3 - Given history consider consulting cardiology for further evaluation and recommendations. May consider stress testing given history - Supplemental oxygen as needed -Morphine when necessary severe chest discomfort. - Nitroglycerin when necessary chest discomfort - EKG next a.m.  GERD - Place order for GI cocktail should patient develop any chest discomfort. Reassess  Hypertension - Will continue lisinopril. Continue to monitor blood pressures. -Blood pressure relatively well controlled  Anxiety - Stable continue home regimen  Chronic back pain -Continue home regimen  Nicotine dependence - Recommend and encourage cessation -  Nicotine patch  Code Status: Full Family Communication: Discussed with patient Disposition Plan: Pending further workup, to telemetry  Time spent: More than 55 minute  Velvet Bathe [facility] Pager [phone]

## 2013-05-14 NOTE — Progress Notes (Signed)
Patient admitted earlier today. H&P reviewed.  Still with retrosternal chest pain. Feels as if someone is sitting on him. 3/10. Some SOB.  VSS Lungs are CTA bil Heart: S1S2 Normal regular, no S3S4. No rubs murmurs or bruits Abdo: Soft, NT. BS present. A&O x3. No focal deficits.  Chest Pain being evaluated by cardiology. CTA ordered to evaluate coronaries. On Aspirin. Has chronic back pain. Continue Oxy. Other issues stable.  Management per cards at this point.  Jeremiah Goodwin 05/14/2013 1:47 PM

## 2013-05-14 NOTE — CV Procedure (Addendum)
PROCEDURE:  Left heart catheterization with selective coronary angiography, left ventriculogram. PCI RCA  INDICATIONS:  Unstable angina  The risks, benefits, and details of the procedure were explained to the patient.  The patient verbalized understanding and wanted to proceed.  Informed written consent was obtained.  PROCEDURE TECHNIQUE:  After Xylocaine anesthesia a 72F slender sheath was placed in the right radial artery with a single anterior needle wall stick.  IV heparin was given. Right coronary angiography was done using a Judkins R4 guide catheter.  Left coronary angiography was done using a Judkins L3.5 guide catheter.  Left ventriculography was done using a pigtail catheter.  A TR band was used for hemostasis.   CONTRAST:  Total of 140 cc.  COMPLICATIONS:  None.    HEMODYNAMICS:  Aortic pressure was 103/66; LV pressure was 104/12; LVEDP 18.  There was no gradient between the left ventricle and aorta.    ANGIOGRAPHIC DATA:   The left main coronary artery is widely patent.  The left anterior descending artery is a large vessel which wraps around the apex. There is mild atherosclerosis diffusely. There is a medium-sized diagonal vessel which is patent. There is a 50% stenosis at the ostium of the largest diagonal, which the third diagonal. The first 2 diagonals are small and patent..  The left circumflex artery is a large vessel. There is mild atherosclerosis. There is a small first OM which is widely patent. The second OM is large and widely patent. The third OM is medium-sized and widely patent. There is a medium size fourth obtuse marginal which is also widely patent.  The right coronary artery is a medium-sized, dominant vessel. In the mid vessel, there is a 90% stenosis, which appears like a spontaneous dissection in an area of atherosclerosis.  LEFT VENTRICULOGRAM:  Left ventricular angiogram was done in the 30 RAO projection and revealed normal left ventricular wall  motion and systolic function with an estimated ejection fraction of 60%.  LVEDP was 20 mmHg.  PCI NARRATIVE: A JR 4 guiding catheters using to engage the RCA. A pro-water wire was placed across the area of stenosis. A 2.0 x 8 balloon was used to predilate the lesion. A 2.0 x 15 mm mini vision stent was then employed. A 2.5 x 12 noncompliant balloon was used to post dilate the stent to 2.6 mm in diameter. There is an excellent vein graft result with no residual stenosis. There was still some moderate disease in the right coronary artery. We used a bare-metal stent due to the patient's recent history of rectal bleeding and need for a GI workup. I did not want to commit him to long-term dual antiplatelet therapy. That is also why we did not cover the entire area of disease in the mid right coronary artery.  IMPRESSIONS:  1. Normal left main coronary artery. 2. Mild atherosclerosis in the left anterior descending artery and its branches. 3. Mild atherosclerosis in the left circumflex artery and its branches. 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  Moderately diseased vessel diffusely.  Successful bare-metal stent placement to the mid right coronary artery with a 2.0 x 15 mini vision stent, postdilated to 2.6 mm in diameter.  We used a bare-metal stent due to the patient's recent history of rectal bleeding and need for a GI workup. I did not want to commit him to long-term dual antiplatelet therapy. That is also why we did not cover the entire area of disease  in the mid right coronary artery. 4. Normal left ventricular systolic function.  LVEDP 20 mmHg.  Ejection fraction 60 %.  RECOMMENDATION:  Continue aspirin and Plavix for at least a month. We'll continue tirofiban for 12 hours. He has received aspirin and Plavix in the cath lab. Continue aggressive secondary prevention including smoking cessation. He'll need to be a lipid-lowering drug.

## 2013-05-15 ENCOUNTER — Encounter (HOSPITAL_COMMUNITY): Payer: Self-pay | Admitting: Nurse Practitioner

## 2013-05-15 DIAGNOSIS — I2510 Atherosclerotic heart disease of native coronary artery without angina pectoris: Secondary | ICD-10-CM | POA: Insufficient documentation

## 2013-05-15 DIAGNOSIS — K625 Hemorrhage of anus and rectum: Secondary | ICD-10-CM

## 2013-05-15 DIAGNOSIS — I10 Essential (primary) hypertension: Secondary | ICD-10-CM

## 2013-05-15 DIAGNOSIS — I200 Unstable angina: Secondary | ICD-10-CM

## 2013-05-15 DIAGNOSIS — F17200 Nicotine dependence, unspecified, uncomplicated: Secondary | ICD-10-CM

## 2013-05-15 LAB — CBC
HEMATOCRIT: 38.6 % — AB (ref 39.0–52.0)
Hemoglobin: 13.1 g/dL (ref 13.0–17.0)
MCH: 31.2 pg (ref 26.0–34.0)
MCHC: 33.9 g/dL (ref 30.0–36.0)
MCV: 91.9 fL (ref 78.0–100.0)
PLATELETS: 216 10*3/uL (ref 150–400)
RBC: 4.2 MIL/uL — ABNORMAL LOW (ref 4.22–5.81)
RDW: 13.4 % (ref 11.5–15.5)
WBC: 5.4 10*3/uL (ref 4.0–10.5)

## 2013-05-15 LAB — BASIC METABOLIC PANEL
BUN: 15 mg/dL (ref 6–23)
CHLORIDE: 106 meq/L (ref 96–112)
CO2: 22 mEq/L (ref 19–32)
Calcium: 9 mg/dL (ref 8.4–10.5)
Creatinine, Ser: 0.86 mg/dL (ref 0.50–1.35)
GFR calc non Af Amer: >90 mL/min (ref >90)
Glucose, Bld: 133 mg/dL — ABNORMAL HIGH (ref 70–99)
POTASSIUM: 4.3 meq/L (ref 3.7–5.3)
Sodium: 140 mEq/L (ref 137–147)

## 2013-05-15 MED ORDER — CLOPIDOGREL BISULFATE 75 MG PO TABS: 75.0000 mg | ORAL_TABLET | Freq: Every day | ORAL | Status: DC

## 2013-05-15 MED ORDER — NITROGLYCERIN 0.4 MG SL SUBL: 0.4000 mg | SUBLINGUAL_TABLET | SUBLINGUAL | Status: DC | PRN

## 2013-05-15 MED ORDER — OXYCODONE HCL 5 MG PO TABS
10.0000 mg | ORAL_TABLET | Freq: Four times a day (QID) | ORAL | Status: DC | PRN
  Administered 2013-05-15 (×2): 10 mg via ORAL
  Filled 2013-05-15 (×2): qty 2

## 2013-05-15 MED ORDER — NICOTINE 14 MG/24HR TD PT24: 14.0000 mg | MEDICATED_PATCH | Freq: Every day | TRANSDERMAL | Status: DC

## 2013-05-15 NOTE — Discharge Instructions (Signed)

## 2013-05-15 NOTE — Discharge Summary (Signed)
Discharge Summary   Patient ID: Jeremiah Goodwin,  MRN: [id number], DOB/AGE: 09-29-1955 58 y.o.  Admit date: 05/13/2013 Discharge date: 05/15/2013  Primary Care Provider: Ruthe Mannan, MD Primary Cardiologist: P. Johnsie Cancel, MD   Discharge Diagnoses Principal Problem:   Unstable angina/CAD  *Status-post PCI/BMS to the RCA this admission.  Active Problems:   TOBACCO ABUSE   HTN (hypertension)   HYPERCHOLESTEROLEMIA   ANXIETY   GASTROESOPHAGEAL REFLUX DISEASE   BACK PAIN, CHRONIC   OA (osteoarthritis) of hip   Rectal bleeding  Allergies Allergies  Allergen Reactions  . Hydrocodone-Acetaminophen Itching    REACTION: itching   Procedures  Cardiac CT Angiography 1.20.2015  IMPRESSION: 1) Very high calcium score 344 84th percentile for age and sex matched controls 2) Non obstructive mixed plaque in the proximal and mid RCA. Cannot exclude >50% mixed plaque in the proximal LAD Patient will be referred for Cath given mixed nature of plaque, location and presentation with chest pain. 3) Normal ascending aorta 3.6 cm _____________   Cardiac Catheterization and Percutaneous Coronary Intervention 1.20.2015  ANGIOGRAPHIC DATA:     The left main coronary artery is widely patent.  The left anterior descending artery is a large vessel which wraps around the apex. There is mild atherosclerosis diffusely. There is a medium-sized diagonal vessel which is patent. There is a 50% stenosis at the ostium of the largest diagonal, which the third diagonal. The first 2 diagonals are small and patent..  The left circumflex artery is a large vessel. There is mild atherosclerosis. There is a small first OM which is widely patent. The second OM is large and widely patent. The third OM is medium-sized and widely patent. There is a medium size fourth obtuse marginal which is also widely patent.  The right coronary artery is a medium-sized, dominant vessel. In the mid vessel, there is a 90% stenosis,  which appears like a spontaneous dissection in an area of atherosclerosis.   **The RCA was successfully stented using a 2.0 x 15 mm mini vision bare-metal stent.**  LEFT VENTRICULOGRAM:  Left ventricular angiogram was done in the 30 RAO projection and revealed normal left ventricular wall motion and systolic function with an estimated ejection fraction of 60%.  LVEDP was 20 mmHg.  IMPRESSIONS:    1. Normal left main coronary artery. 2. Mild atherosclerosis in the left anterior descending artery and its branches. 3. Mild atherosclerosis in the left circumflex artery and its branches. 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  Moderately diseased vessel diffusely.  Successful bare-metal stent placement to the mid right coronary artery with a 2.0 x 15 mini vision stent, postdilated to 2.6 mm in diameter.  We used a bare-metal stent due to the patient's recent history of rectal bleeding and need for a GI workup. I did not want to commit him to long-term dual antiplatelet therapy. That is also why we did not cover the entire area of disease in the mid right coronary artery. 4. Normal left ventricular systolic function.  LVEDP 20 mmHg.  Ejection fraction 60 %. _____________   History of Present Illness  58 year old male without prior cardiac history. He has recently been evaluated by primary care and gastroenterology for heme positive stools. He was otherwise in his usual state of health until several weeks ago when he began to experience intermittent substernal chest discomfort and pressure. Symptoms worsened on January 19 prompting him to present to the emergency department where troponin was normal and ECG showed an incomplete  right bundle branch block. He was admitted for further evaluation.  Hospital Course  Patient ruled out for myocardial infarction. Cardiology was consulted and decision was made to perform cardiac CT angiography. This was performed on January 20 and showed mixed  plaque in the LAD and right coronary artery with a very high calcium score of 344. Obstructive coronary artery disease cannot be ruled out and therefore decision was made to pursue diagnostic catheterization. This was also performed on January 20, and revealed severe stenosis in the right coronary artery and otherwise nonobstructive disease and normal LV function. The right coronary artery was successfully stented using a 2.0 x 15 mm mini vision bare-metal stent. A bare-metal stent was chosen given need for colonoscopy in the setting of guaiac-positive stools with a goal to limit exposure to dual antiplatelet therapy.  Post procedure, [name] is had no further chest pain or dyspnea. He has been ambulating without difficulty. He'll be discharged home today in good condition. He'll require uninterrupted dual antiplatelet therapy for a minimum of  30 days.  Discharge Vitals Blood pressure 112/68, pulse 59, temperature 97.7 F (36.5 C), temperature source Oral, resp. rate 18, height 5\' 7"  (1.702 m), weight 217 lb 4.8 oz (98.567 kg), SpO2 94.00%.  Filed Weights   05/13/13 1949 05/14/13 0112  Weight: 211 lb 6.4 oz (95.89 kg) 217 lb 4.8 oz (98.567 kg)   Labs  CBC  Recent Labs  05/14/13 0145 05/14/13 2320 05/15/13 0420  WBC 6.7  --  5.4  HGB 14.4  --  13.1  HCT 41.0  --  38.6*  MCV 91.1  --  91.9  PLT 257 210 536   Basic Metabolic Panel  Recent Labs  05/13/13 1956 05/14/13 0145 05/15/13 0420  NA 140  --  140  K 3.8  --  4.3  CL 102  --  106  CO2 24  --  22  GLUCOSE 161*  --  133*  BUN 12  --  15  CREATININE 0.86 0.91 0.86  CALCIUM 9.6  --  9.0   Cardiac Enzymes  Recent Labs  05/14/13 0145 05/14/13 0702 05/14/13 1251  TROPONINI <0.30 <0.30 <0.30   Disposition  Pt is being discharged home today in good condition.  Follow-up Plans & Appointments  Follow-up Information   Follow up with [name], MD. (as scheduled)    Specialty:  Family Medicine   Contact  information:   [place] [address] [phone]       Follow up with Murray Hodgkins, NP On 05/21/2013. (2:00 PM - Dr. Kyla Balzarine Nurse Practitioner)    Specialty:  Nurse Practitioner   Contact information:   [address] [place] [address] [phone]      Discharge Medications    Medication List    STOP taking these medications       SUPREP BOWEL PREP Soln  Generic drug:  Na Sulfate-K Sulfate-Mg Sulf      TAKE these medications       ACTONEL 35 MG tablet  Generic drug:  risedronate  take 1 tablet by mouth every week     ALPRAZolam 1 MG tablet  Commonly known as:  XANAX  Take 1 tablet (1 mg total) by mouth 3 (three) times daily as needed.     amitriptyline 50 MG tablet  Commonly known as:  ELAVIL  Take 1 tablet (50 mg total) by mouth at bedtime.     aspirin EC 81 MG tablet  Take 81 mg by mouth daily.     baclofen 10 MG tablet  Commonly known as:  LIORESAL  1/2 to 1 tid prn muscle spasm     clopidogrel 75 MG tablet  Commonly known as:  PLAVIX  Take 1 tablet (75 mg total) by mouth daily with breakfast.     DSS 100 MG Caps  Take 100 mg by mouth 2 (two) times daily.     gabapentin 300 MG capsule  Commonly known as:  NEURONTIN  Take 1-2 capsules (300-600 mg total) by mouth 3 (three) times daily. Takes 2 in the morning 1 at lunch and 2 at night     lisinopril 10 MG tablet  Commonly known as:  PRINIVIL,ZESTRIL  Take 1 tablet (10 mg total) by mouth daily.     nicotine 14 mg/24hr patch  Commonly known as:  NICODERM CQ - dosed in mg/24 hours  Place 1 patch (14 mg total) onto the skin daily.     nitroGLYCERIN 0.4 MG SL tablet  Commonly known as:  NITROSTAT  Place 1 tablet (0.4 mg total) under the tongue every 5 (five) minutes as needed for chest pain.     omega-3 acid ethyl esters 1 G capsule  Commonly known as:  LOVAZA  Take 2 capsules (2 g total) by mouth 2 (two) times daily.     ROXICET PO  Take by mouth. 10mg  1  qid     simvastatin 40 MG tablet  Commonly known as:  ZOCOR  Take 1 tablet (40 mg total) by mouth every evening.       Outstanding Labs/Studies  None  Duration of Discharge Encounter   40 minutes going over instructions, including physician time.  Signed, Murray Hodgkins NP 05/15/2013, 8:12 AM   I have examined the patient and reviewed assessment and plan and discussed with patient.  Agree with above as stated.  Dual antiplatelet without interruption for 30 days. Colonoscopy can be performed in early March with him off of clopidogrel. I stressed the importance of smoking cessation.  Ladavia Lindenbaum S.

## 2013-05-15 NOTE — Progress Notes (Signed)
SUBJECTIVE:  Minimal chest pain  OBJECTIVE:   Vitals:   Filed Vitals:   05/14/13 2200 05/15/13 0200 05/15/13 0400 05/15/13 0714  BP: 121/83 95/61 114/75 112/68  Pulse:      Temp:      TempSrc:      Resp:      Height:      Weight:      SpO2:       I&O's:   Intake/Output Summary (Last 24 hours) at 05/15/13 0737 Last data filed at 05/15/13 0600  Gross per 24 hour  Intake   1260 ml  Output      0 ml  Net   1260 ml   TELEMETRY: Reviewed telemetry pt in NSR:     PHYSICAL EXAM General: Well developed, well nourished, in no acute distress Head:   Normal cephalic and atramatic  Lungs: Clear bilaterally to auscultation and percussion. Heart:   HRRR S1 S2No JVD.   Abdomen:  abdomen soft and non-tender Msk:  Back normal,  Normal strength and tone for age. Extremities:  No edema.   2+ right radial, no hematoma Neuro: Alert and oriented X 3. Psych:  Good affect, responds appropriately   LABS: Basic Metabolic Panel:  Recent Labs  05/13/13 1956 05/14/13 0145 05/15/13 0420  NA 140  --  140  K 3.8  --  4.3  CL 102  --  106  CO2 24  --  22  GLUCOSE 161*  --  133*  BUN 12  --  15  CREATININE 0.86 0.91 0.86  CALCIUM 9.6  --  9.0   Liver Function Tests: No results found for this basename: AST, ALT, ALKPHOS, BILITOT, PROT, ALBUMIN,  in the last 72 hours No results found for this basename: LIPASE, AMYLASE,  in the last 72 hours CBC:  Recent Labs  05/14/13 0145 05/14/13 2320 05/15/13 0420  WBC 6.7  --  5.4  HGB 14.4  --  13.1  HCT 41.0  --  38.6*  MCV 91.1  --  91.9  PLT 257 210 216   Cardiac Enzymes:  Recent Labs  05/14/13 0145 05/14/13 0702 05/14/13 1251  TROPONINI <0.30 <0.30 <0.30   BNP: No components found with this basename: POCBNP,  D-Dimer: No results found for this basename: DDIMER,  in the last 72 hours Hemoglobin A1C: No results found for this basename: HGBA1C,  in the last 72 hours Fasting Lipid Panel: No results found for this basename:  CHOL, HDL, LDLCALC, TRIG, CHOLHDL, LDLDIRECT,  in the last 72 hours Thyroid Function Tests: No results found for this basename: TSH, T4TOTAL, FREET3, T3FREE, THYROIDAB,  in the last 72 hours Anemia Panel: No results found for this basename: VITAMINB12, FOLATE, FERRITIN, TIBC, IRON, RETICCTPCT,  in the last 72 hours Coag Panel:   Lab Results  Component Value Date   INR 0.88 04/26/2012   INR 2.7* 03/25/2007   INR 2.3* 03/24/2007    RADIOLOGY: Dg Chest 2 View  05/13/2013   CLINICAL DATA:  Chest pain  EXAM: CHEST  2 VIEW  COMPARISON:  Prior radiograph from 10/31/2011  FINDINGS: The cardiac and mediastinal silhouettes are stable in size and contour, and remain within normal limits.  The lungs are normally inflated. No airspace consolidation, pleural effusion, or pulmonary edema is identified. There is no pneumothorax.  No acute osseous abnormality identified. ACDF overlies the cervical spine and  IMPRESSION: No active cardiopulmonary disease.   Electronically Signed   By: Pincus Badder.D.  On: 05/13/2013 20:57   Ct Coronary Morp W/cta Cor W/score W/ca W/cm &/or Wo/cm  05/14/2013   ADDENDUM REPORT: 05/14/2013 15:26  ADDENDUM: OVER-READ INTERPRETATION  CT CHEST  The following report is an over-read performed by radiologist Dr. Julian Hy of [facility] on 05/14/2013. This over-read does not include interpretation of cardiac or coronary anatomy or pathology. The CTA interpretation by the cardiologist is attached.  Visualized lungs are essentially clear. Mild dependent atelectasis. No suspicious pulmonary nodules.  No suspicious mediastinal adenopathy.  Visualized soft tissues are unremarkable.  Degenerative changes of the thoracic spine.  IMPRESSION:  No significant extracardiac findings.   Electronically Signed   By: Julian Hy M.D.   On: 05/14/2013 15:26   05/14/2013   CLINICAL DATA:  Chest pain  EXAM: Cardiac CTA  MEDICATIONS: Sub lingual nitro. 4mg  and lopressor 50mg   PO and 5mg  iv at scanner  TECHNIQUE: The patient was scanned on a Philips 790 slice scanner. Gantry rotation speed was 270 msecs. Collimation was .58mm. A 100 kV prospective scan was triggered in the descending thoracic aorta at 111 HU's with 5% padding centered around 78% of the R-R interval. Average HR during the scan was 58 bpm. The 3D data set was interpreted on a dedicated work station using MPR, MIP and VRT modes. A total of 80cc of contrast was used.  FINDINGS: Non-cardiac: See separate report from [facility]. No significant findings on limited lung and soft tissue windows.  Calcium Score:  344 mostly in the LAD but present in all 3 vessels  Coronary Arteries: Right dominant with no anomalies  LM:  Less than 30% calcific disease  LAD: Complex mixed plaque in the proximal vessel 50% or greater stenosis. Mid vessel 50% or less stenosis Distal 30% mixed plaque  D1:  Small vessel normal  D2:  Large vessel less than 50% calcific plaque ostium  D3:  Small and normal  D4:  Small and normal  Circumflex:  Less than 30% disease proximally  OM1:  Small vessel normal  OM2: Large vessel normal  OM3:  Normal  OM4:  Normal  RCA: Dominant Less than 50% mixed plaque in the proximal and mid vessel.  PDA:  Normal  PLA:  Normal  IMPRESSION: 1) Very high calcium score 344 84th percentile for age and sex matched controls  2) Non obstructive mixed plaque in the proximal and mid RCA. Cannot exclude >50% mixed plaque in the proximal LAD Patient will be referred for  Cath given mixed nature of plaque, location and presentation with chest pain  3) Normal ascending aorta 3.6 cm  Jenkins Rouge  Electronically Signed: By: Jenkins Rouge M.D. On: 05/14/2013 12:52      ASSESSMENT: CAD, rectal bleeding  PLAN:  S/p PCI to RCA.  BMS used due to recent rectal bleeding and scheduled colonoscopy.  Would have to move colonoscopy to early March.  He should continue one month of ASA/ plavix without interruption.    Recommend cardiac  rehab and smoking cessation, lipid lowering therapy.    Jettie Booze., MD  05/15/2013  7:37 AM

## 2013-05-15 NOTE — Progress Notes (Signed)
CARDIAC REHAB PHASE I   PRE:  Rate/Rhythm: 64 SR    BP: sitting 142/88    SaO2:   MODE:  Ambulation: 800 ft   POST:  Rate/Rhythm: 78 SR    BP: sitting 138/68     SaO2:   Pt tolerated well. Sts he is having mild chest discomfort since his PCI, 2/10. Also is tender to touch. Did not seem to increase with walking, "maybe slightly". Pt has significant stress from his home life and wife. Smokes for stress relief. Discussed options for stress relief and smoking cessation. Gave resources. Not sure pt will be able to quit. Interested in [facility] and will send referral to [place]. [phone]  Josephina Shih [place] CES, ACSM 05/15/2013 9:28 AM

## 2013-05-15 NOTE — Progress Notes (Signed)
TR BAND REMOVAL  LOCATION:  right radial  DEFLATED PER PROTOCOL:  yes  TIME BAND OFF / DRESSING APPLIED:   2200   SITE UPON ARRIVAL:   Level 0  SITE AFTER BAND REMOVAL:  Level 0  REVERSE ALLEN'S TEST:    positive  CIRCULATION SENSATION AND MOVEMENT:  Within Normal Limits  yes  COMMENTS:     

## 2013-05-16 ENCOUNTER — Encounter: Payer: Medicare Other | Admitting: Internal Medicine

## 2013-05-21 ENCOUNTER — Encounter: Payer: Medicare Other | Admitting: Nurse Practitioner

## 2013-05-21 ENCOUNTER — Encounter: Payer: Self-pay | Admitting: *Deleted

## 2013-05-21 ENCOUNTER — Encounter: Payer: Self-pay | Admitting: Nurse Practitioner

## 2013-05-21 ENCOUNTER — Ambulatory Visit (INDEPENDENT_AMBULATORY_CARE_PROVIDER_SITE_OTHER): Payer: Medicare Other | Admitting: Nurse Practitioner

## 2013-05-21 VITALS — BP 122/82 | HR 86 | Ht 65.0 in | Wt 210.0 lb

## 2013-05-21 DIAGNOSIS — R195 Other fecal abnormalities: Secondary | ICD-10-CM

## 2013-05-21 DIAGNOSIS — F17200 Nicotine dependence, unspecified, uncomplicated: Secondary | ICD-10-CM

## 2013-05-21 DIAGNOSIS — I2510 Atherosclerotic heart disease of native coronary artery without angina pectoris: Secondary | ICD-10-CM

## 2013-05-21 DIAGNOSIS — I10 Essential (primary) hypertension: Secondary | ICD-10-CM

## 2013-05-21 DIAGNOSIS — Z720 Tobacco use: Secondary | ICD-10-CM

## 2013-05-21 DIAGNOSIS — E785 Hyperlipidemia, unspecified: Secondary | ICD-10-CM

## 2013-05-21 NOTE — Patient Instructions (Signed)
Your physician wants you to follow-up in: in 3 months with Dr Blima Singer will receive a reminder letter in the mail two months in advance. If you don't receive a letter, please call our office to schedule the follow-up appointment.  Your physician recommends that you continue on your current medications as directed. Please refer to the Current Medication list given to you today.

## 2013-05-21 NOTE — Progress Notes (Signed)
Patient Name: Jeremiah Goodwin Date of Encounter: 05/21/2013  Primary Care Provider:  [name], MD Primary Cardiologist:  P. Johnsie Cancel, MD   Patient Profile  58 y/o male with recent admission for [place] s/p PCI of the RCA who presents for clinic f/u.  Problem List   Past Medical History  Diagnosis Date  . Hyperlipidemia   . Chronic pain syndrome     Knees to feet and low back, on Neurontin.  Marland Kitchen Hypertension   . Allergy   . Osteoporosis   . Arthritis   . Depression   . CAD (coronary artery disease)     a. 04/2013 Card CTA: mixed plaque in RCA/LAD, Ca 344;  b. 04/2013 Cath: LM nl, LAD min irregs, D3 50ost, LCX and OM's nl, RCA 98m (2.0x15 Vision BMS), EF 60%.   Past Surgical History  Procedure Laterality Date  . Spine surgery      Has had surgery spine and low back  . Joint replacement      Left total knee arthroplasty  . Back surgery      8 different times  . Hemorrhoid surgery    . Total hip arthroplasty  05/04/2012    Procedure: TOTAL HIP ARTHROPLASTY;  Surgeon: Gearlean Alf, MD;  Location: [facility];  Service: Orthopedics;  Laterality: Left;    Allergies  Allergies  Allergen Reactions  . Hydrocodone-Acetaminophen Itching    REACTION: itching    HPI  58 y/o male that was recently admitted to [facility] with progressive exertional chest discomfort.  He underwent cardiac CT, which suggested mixed plaque in the LAD and RCA.  This was followed by a cath, which showed severe RCA dzs, and otw nonobs CAD and nl LV fxn.  Because the pt had previously had guaiac + stools and was scheduled for colonoscopy, decision was made to place a bare metal stent, in order to limit DAPT exposure to a minimum of 30 days, so that he may have his colonoscopy sooner rather than later.  Since discharge, he has been doing well w/o exertional chest pain or dyspnea.  He has noted occasional chest wall tenderness.  He says that he has only smoked 2 cigarettes since discharge and is committed to quitting.  He  is taking his meds as prescribed.  He and his wife have several questions related to his hospital course, PCI, diet, and exercise and we spent some time reviewing these issues.  He denies chest pain, palpitations, dyspnea, pnd, orthopnea, n, v, dizziness, syncope, edema, weight gain, or early satiety.   Home Medications  Prior to Admission medications   Medication Sig Start Date End Date Taking? Authorizing Provider  ACTONEL 35 MG tablet take 1 tablet by mouth every week 11/21/12  Yes [name], MD  ALPRAZolam Duanne Moron) 1 MG tablet Take 1 tablet (1 mg total) by mouth 3 (three) times daily as needed. 02/13/13  Yes [name], MD  amitriptyline (ELAVIL) 50 MG tablet Take 1 tablet (50 mg total) by mouth at bedtime. 02/13/13  Yes [name], MD  aspirin EC 81 MG tablet Take 81 mg by mouth daily.   Yes Historical Provider, MD  baclofen (LIORESAL) 10 MG tablet 1/2 to 1 tid prn muscle spasm 09/30/12  Yes [name], MD  clopidogrel (PLAVIX) 75 MG tablet Take 1 tablet (75 mg total) by mouth daily with breakfast. 05/15/13  Yes [name], NP  docusate sodium 100 MG CAPS Take 100 mg by mouth 2 (two) times daily. 05/06/12  Yes Alexzandrew Perkins, PA-C  gabapentin (NEURONTIN) 300 MG capsule Take 1-2 capsules (300-600 mg total) by mouth 3 (three) times daily. Takes 2 in the morning 1 at lunch and 2 at night 09/30/12  Yes [name], MD  lisinopril (PRINIVIL,ZESTRIL) 10 MG tablet Take 1 tablet (10 mg total) by mouth daily. 02/13/13  Yes [name], MD  nicotine (NICODERM CQ - DOSED IN MG/24 HOURS) 14 mg/24hr patch Place 1 patch (14 mg total) onto the skin daily. 05/15/13  Yes [name], NP  nitroGLYCERIN (NITROSTAT) 0.4 MG SL tablet Place 1 tablet (0.4 mg total) under the tongue every 5 (five) minutes as needed for chest pain. 05/15/13  Yes [name], NP  omega-3 acid ethyl esters (LOVAZA) 1 G capsule Take 2 capsules (2 g total) by mouth 2 (two) times daily.  10/05/12  Yes [name], PA-C  Oxycodone-Acetaminophen (ROXICET PO) Take by mouth. 10mg  1 qid   Yes Historical Provider, MD  simvastatin (ZOCOR) 40 MG tablet Take 1 tablet (40 mg total) by mouth every evening. 09/30/12  Yes [name], MD   Review of Systems  As above, he is doing well.  He has not had any angina or dyspnea, but has had some chest wall tenderness.  He denies any dark stools or rectal bleeding.  All other systems reviewed and are otherwise negative except as noted above.  Physical Exam  Blood pressure 122/82, pulse 86, height 5\' 5"  (1.651 m), weight 210 lb (95.255 kg).  General: Pleasant, NAD Psych: Normal affect. Neuro: Alert and oriented X 3. Moves all extremities spontaneously. HEENT: Normal  Neck: Supple without bruits or JVD. Lungs:  Resp regular and unlabored, CTA. Heart: RRR no s3, s4, or murmurs. Abdomen: Soft, non-tender, non-distended, BS + x 4.  Extremities: No clubbing, cyanosis or edema. DP/PT/Radials 2+ and equal bilaterally.  R wrist cath site is w/o bleeding/bruit/hematoma.  Accessory Clinical Findings  ECG - rsr, 86, inc rbbb, r axis.  Assessment & Plan  1.  CAD:  S/p pci/bms to the RCA.  Otherwise has nonobstructive CAD.  He is doing well post-pci.  Cont asa, plavix, statin, bb, acei.  He will require a minimum of 30 days of uninterrupted dual antiplatelet therapy (asa/plavix).  Following that 30 day window, he would be able to come off of it for his colonoscopy.  I have communicated this to him.  2.  HTN: stable.  3. HL:  LDL 79 in October 2014.  He remains on statin therapy.  4.  Guaiac + Stools: pending colonoscopy.  As above, this should be deferred at least until he has completed 30 days of uninterrupted asa/plavix therapy.  5.  Tob Abuse:  Complete cessation advised.  He is motivated to quit.  He is considering using a nicotine patch.  6. Disposition: f/u with Dr. Johnsie Cancel in 3 mos or sooner if necessary.  Murray Hodgkins,  NP 05/21/2013, 11:34 AM

## 2013-05-24 ENCOUNTER — Telehealth: Payer: Self-pay | Admitting: Nurse Practitioner

## 2013-05-24 NOTE — Telephone Encounter (Signed)
New problem   Pt stated he was told to have PT for his stent sx,  but he can't afford it. Pt want to know if there was another option. Please call pt

## 2013-05-24 NOTE — Telephone Encounter (Signed)
PER DR NISHAN  MAY  WALK AND  EXERCISE ON OWN OR JOIN  THE   Y  PER  PT  HAS  ALREADY STARTED GOING TO  THE Y .Adonis Housekeeper

## 2013-05-24 NOTE — Telephone Encounter (Signed)
Pt states he was referrer to Cardiac rehab for post stent placement  By Ignacia Bayley NP on 1/21 /15. Pt's  Insurance  won't pay So he can't afford the  $50.00 he need to pay every time he goes. Pt would like to know if Dr. Johnsie Cancel MD can recommend  something that  he can afford in place  of rehab.

## 2013-05-30 ENCOUNTER — Telehealth: Payer: Self-pay | Admitting: Cardiovascular Disease

## 2013-05-30 NOTE — Telephone Encounter (Signed)
Pt calls today b/c he had a nosebleed from both nostrils last night. He was able to stop the nosebleed by lying down & using an ice pack This was his first nosebleed & he is concerned about blowing his nose again.  Recommended he could use a cool mist humidifier in his home and/or use saline nasal spray as needed  Will forward to Dr. Johnsie Cancel for review Horton Chin RN

## 2013-05-30 NOTE — Telephone Encounter (Signed)
New message  Patient is experiencing nose bleeds. Wants to know if he should continue med's. Please call and advise.

## 2013-05-30 NOTE — Telephone Encounter (Signed)
That's good advice don't stop antiplatelets

## 2013-06-19 ENCOUNTER — Encounter: Payer: Self-pay | Admitting: *Deleted

## 2013-06-24 ENCOUNTER — Ambulatory Visit: Payer: Medicare Other | Admitting: Internal Medicine

## 2013-07-16 ENCOUNTER — Telehealth: Payer: Self-pay | Admitting: Cardiovascular Disease

## 2013-07-16 NOTE — Telephone Encounter (Signed)
Advised patient ok to take with Plavix.   Patient was wanting to know if ok to have spinal injections with Plavix at pain clinic. Patient states they have not helped him in the past and does prefers not to do them again. Will forward to christine Y. LPN and Dr Johnsie Cancel for review

## 2013-07-16 NOTE — Telephone Encounter (Signed)
New message     Can pt take red yeast rice and CO Q 10 pills with his blood thinner

## 2013-07-17 ENCOUNTER — Encounter: Payer: Self-pay | Admitting: *Deleted

## 2013-07-17 NOTE — Telephone Encounter (Signed)
PT  NOTIFIED  NEEDS LETTER  STATING  MAY NOT   HAVE  DONE BEFORE  July PER  DR NISHAN IF  PT NEEDS TO HOLD PLAVIX./CY

## 2013-07-17 NOTE — Telephone Encounter (Signed)
Had bare metal stent in January should not stop plavix for 6 months or until july

## 2013-07-31 ENCOUNTER — Other Ambulatory Visit: Payer: Self-pay | Admitting: Family Medicine

## 2013-07-31 DIAGNOSIS — F411 Generalized anxiety disorder: Secondary | ICD-10-CM

## 2013-07-31 MED ORDER — ALPRAZOLAM 1 MG PO TABS: ORAL_TABLET | ORAL | Status: DC

## 2013-08-08 ENCOUNTER — Encounter: Payer: Self-pay | Admitting: Internal Medicine

## 2013-08-08 ENCOUNTER — Ambulatory Visit (INDEPENDENT_AMBULATORY_CARE_PROVIDER_SITE_OTHER): Payer: Medicare Other | Admitting: Internal Medicine

## 2013-08-08 VITALS — BP 108/66 | HR 68 | Ht 66.0 in | Wt 202.8 lb

## 2013-08-08 DIAGNOSIS — Z1211 Encounter for screening for malignant neoplasm of colon: Secondary | ICD-10-CM

## 2013-08-08 DIAGNOSIS — I2510 Atherosclerotic heart disease of native coronary artery without angina pectoris: Secondary | ICD-10-CM

## 2013-08-08 DIAGNOSIS — Z9861 Coronary angioplasty status: Secondary | ICD-10-CM

## 2013-08-08 NOTE — Patient Instructions (Addendum)
You have been given a separate informational sheet regarding your tobacco use, the importance of quitting and local resources to help you quit.  You have been scheduled for a colonoscopy with propofol. Please follow written instructions given to you at your visit today.  Please use the prep kit you have at home. If you use inhalers (even only as needed), please bring them with you on the day of your procedure. Your physician has requested that you go to www.startemmi.com and enter the access code given to you at your visit today. This web site gives a general overview about your procedure. However, you should still follow specific instructions given to you by our office regarding your preparation for the procedure.  I appreciate the opportunity to care for you.

## 2013-08-08 NOTE — Progress Notes (Signed)
    Subjective:    Patient ID: Jeremiah Goodwin, male    DOB: 1956-02-15, 58 y.o.   MRN: [id number]  HPI  Is a very nice man who was scheduled for a screening colonoscopy but developed chest pain and underwent cardiac catheterization with bare-metal stent placement. He was placed on clopidogrel and aspirin and has done well. He stopped his clopidogrel but it's not clear he was supposed to do this though he was okay to hold it prior to a colonoscopy. He has no angina symptoms at this time. He has not had any particular GI symptoms though he moves his bowels every other day or so and wonders if that is normal.  Medications, allergies, past medical history, past surgical history, family history and social history are reviewed and updated in the EMR.     Review of Systems As per history of present illness    Objective:   Physical Exam Black man, middle-aged, well-developed and well-nourished no acute distress   Assessment & Plan:   1. Special screening for malignant neoplasms, colon   2. CAD S/P percutaneous coronary angioplasty - January 2015     Schedule screening colonoscopy. He is not on his clopidogrel right now. Continue aspirin and setting of coronary artery disease. Will ask cardiology if he should resume his clopidogrel.  The risks and benefits as well as alternatives of endoscopic procedure(s) have been discussed and reviewed. All questions answered. The patient agrees to proceed.

## 2013-08-09 ENCOUNTER — Ambulatory Visit (AMBULATORY_SURGERY_CENTER): Payer: Medicare Other | Admitting: Internal Medicine

## 2013-08-09 ENCOUNTER — Encounter: Payer: Self-pay | Admitting: Internal Medicine

## 2013-08-09 VITALS — BP 119/84 | HR 92 | Temp 97.6°F | Resp 19 | Ht 66.0 in | Wt 202.0 lb

## 2013-08-09 DIAGNOSIS — I2510 Atherosclerotic heart disease of native coronary artery without angina pectoris: Secondary | ICD-10-CM

## 2013-08-09 DIAGNOSIS — D126 Benign neoplasm of colon, unspecified: Secondary | ICD-10-CM

## 2013-08-09 DIAGNOSIS — Z1211 Encounter for screening for malignant neoplasm of colon: Secondary | ICD-10-CM

## 2013-08-09 DIAGNOSIS — Z9861 Coronary angioplasty status: Secondary | ICD-10-CM

## 2013-08-09 MED ORDER — CLOPIDOGREL BISULFATE 75 MG PO TABS: 75.0000 mg | ORAL_TABLET | Freq: Every day | ORAL | Status: DC

## 2013-08-09 MED ORDER — SODIUM CHLORIDE 0.9 % IV SOLN: 500.0000 mL | INTRAVENOUS | Status: DC

## 2013-08-09 NOTE — Op Note (Signed)
[place]  [address] [place], [address]   COLONOSCOPY PROCEDURE REPORT  PATIENT: Jeremiah Goodwin, Jeremiah Goodwin  MR#: [id number] BIRTHDATE: [birth year] , 28  yrs. old GENDER: Male ENDOSCOPIST: [name], MD, [facility] PROCEDURE DATE:  08/09/2013 PROCEDURE:   Colonoscopy with biopsy First Screening Colonoscopy - Avg.  risk and is 50 yrs.  old or older Yes.  Prior Negative Screening - Now for repeat screening. N/A  History of Adenoma - Now for follow-up colonoscopy & has been > or = to 3 yrs.  N/A  Polyps Removed Today? Yes. ASA CLASS:   Class III INDICATIONS:average risk screening and first colonoscopy. MEDICATIONS: propofol (Diprivan) 250mg  IV, MAC sedation, administered by CRNA, and These medications were titrated to patient response per physician's verbal order  DESCRIPTION OF PROCEDURE:   After the risks benefits and alternatives of the procedure were thoroughly explained, informed consent was obtained.  A digital rectal exam revealed no abnormalities of the rectum, A digital rectal exam revealed no prostatic nodules, and A digital rectal exam revealed the prostate was not enlarged.   The LB [id number]  endoscope was introduced through the anus and advanced to the cecum, which was identified by both the appendix and ileocecal valve. No adverse events experienced.   The quality of the prep was excellent using Suprep  The instrument was then slowly withdrawn as the colon was fully examined.  COLON FINDINGS: A sessile polyp measuring 2 mm in size was found in the transverse colon.  A polypectomy was performed with cold forceps.  The resection was complete and the polyp tissue was completely retrieved.   Mild diverticulosis was noted in the sigmoid colon.   The colon mucosa was otherwise normal. Retroflexed views revealed no abnormalities. The time to cecum=4 minutes 10 seconds.  Withdrawal time=8 minutes 37 seconds.  The scope was withdrawn and the procedure  completed. COMPLICATIONS: There were no complications.  ENDOSCOPIC IMPRESSION: 1.   Sessile polyp measuring 2 mm in size was found in the transverse colon; polypectomy was performed with cold forceps 2.   Mild diverticulosis was noted in the sigmoid colon 3.   The colon mucosa was otherwise normal - excellent prep - first colonoscopy  RECOMMENDATIONS: Timing of repeat colonoscopy will be determined by pathology findings.   eSigned:  [name], MD, [facility] 08/09/2013 2:43 PM   cc: [name], MD and The Patient

## 2013-08-09 NOTE — Progress Notes (Signed)
Report to pacu rn, vss, bbs=clear 

## 2013-08-09 NOTE — Patient Instructions (Addendum)
I found and removed one tiny polyp that looks benign. Do not worry about this. It could mean that you need a repeat colonoscopy in 5 vs. 10 years.  Please start taking Plavix (clopidogrel) again. I discussed with cardiology and they recommend you take it for 1 year after the stent placement. I sent a refill prescription to the pharmacy.  I will let you know pathology results and when to have another routine colonoscopy by mail.  I appreciate the opportunity to care for you. [name], MD, FACG   YOU HAD AN ENDOSCOPIC PROCEDURE TODAY AT [facility]: Refer to the procedure report that was given to you for any specific questions about what was found during the examination.  If the procedure report does not answer your questions, please call your gastroenterologist to clarify.  If you requested that your care partner not be given the details of your procedure findings, then the procedure report has been included in a sealed envelope for you to review at your convenience later.  YOU SHOULD EXPECT: Some feelings of bloating in the abdomen. Passage of more gas than usual.  Walking can help get rid of the air that was put into your GI tract during the procedure and reduce the bloating. If you had a lower endoscopy (such as a colonoscopy or flexible sigmoidoscopy) you may notice spotting of blood in your stool or on the toilet paper. If you underwent a bowel prep for your procedure, then you may not have a normal bowel movement for a few days.  DIET: Your first meal following the procedure should be a light meal and then it is ok to progress to your normal diet.  A half-sandwich or bowl of soup is an example of a good first meal.  Heavy or fried foods are harder to digest and may make you feel nauseous or bloated.  Likewise meals heavy in dairy and vegetables can cause extra gas to form and this can also increase the bloating.  Drink plenty of fluids but you should avoid alcoholic  beverages for 24 hours.  ACTIVITY: Your care partner should take you home directly after the procedure.  You should plan to take it easy, moving slowly for the rest of the day.  You can resume normal activity the day after the procedure however you should NOT DRIVE or use heavy machinery for 24 hours (because of the sedation medicines used during the test).    SYMPTOMS TO REPORT IMMEDIATELY: A gastroenterologist can be reached at any hour.  During normal business hours, 8:30 AM to 5:00 PM Monday through Friday, call [phone].  After hours and on weekends, please call the GI answering service at [phone] who will take a message and have the physician on call contact you.   Following lower endoscopy (colonoscopy or flexible sigmoidoscopy):  Excessive amounts of blood in the stool  Significant tenderness or worsening of abdominal pains  Swelling of the abdomen that is new, acute  Fever of 100F or higher    FOLLOW UP: If any biopsies were taken you will be contacted by phone or by letter within the next 1-3 weeks.  Call your gastroenterologist if you have not heard about the biopsies in 3 weeks.  Our staff will call the home number listed on your records the next business day following your procedure to check on you and address any questions or concerns that you may have at that time regarding the information given to you  following your procedure. This is a courtesy call and so if there is no answer at the home number and we have not heard from you through the emergency physician on call, we will assume that you have returned to your regular daily activities without incident.  SIGNATURES/CONFIDENTIALITY: You and/or your care partner have signed paperwork which will be entered into your electronic medical record.  These signatures attest to the fact that that the information above on your After Visit Summary has been reviewed and is understood.  Full responsibility of the confidentiality  of this discharge information lies with you and/or your care-partner.

## 2013-08-09 NOTE — Progress Notes (Signed)
Called to room to assist during endoscopic procedure.  Patient ID and intended procedure confirmed with present staff. Received instructions for my participation in the procedure from the performing physician.  

## 2013-08-12 ENCOUNTER — Telehealth: Payer: Self-pay | Admitting: *Deleted

## 2013-08-12 NOTE — Telephone Encounter (Signed)
No answer, message left for the patient. 

## 2013-08-13 ENCOUNTER — Encounter: Payer: Self-pay | Admitting: Internal Medicine

## 2013-08-13 NOTE — Progress Notes (Signed)
Quick Note:  Diminutive hyperplastic polyp Repeat colon 2025 ______

## 2013-08-16 ENCOUNTER — Encounter: Payer: Self-pay | Admitting: Cardiovascular Disease

## 2013-08-19 ENCOUNTER — Ambulatory Visit (INDEPENDENT_AMBULATORY_CARE_PROVIDER_SITE_OTHER): Payer: Medicare Other | Admitting: Cardiovascular Disease

## 2013-08-19 ENCOUNTER — Encounter: Payer: Self-pay | Admitting: Cardiovascular Disease

## 2013-08-19 VITALS — BP 125/75 | HR 81 | Ht 66.0 in | Wt 201.8 lb

## 2013-08-19 DIAGNOSIS — K635 Polyp of colon: Secondary | ICD-10-CM | POA: Insufficient documentation

## 2013-08-19 DIAGNOSIS — I10 Essential (primary) hypertension: Secondary | ICD-10-CM

## 2013-08-19 DIAGNOSIS — D126 Benign neoplasm of colon, unspecified: Secondary | ICD-10-CM

## 2013-08-19 DIAGNOSIS — I2510 Atherosclerotic heart disease of native coronary artery without angina pectoris: Secondary | ICD-10-CM

## 2013-08-19 DIAGNOSIS — E7800 Pure hypercholesterolemia, unspecified: Secondary | ICD-10-CM

## 2013-08-19 NOTE — Progress Notes (Signed)
Patient ID: Jeremiah Goodwin, male   DOB: 02-15-1956, 58 y.o.   MRN: [id number] 58 yo f/u CAD  Hospitalized 1/15 with angina  Abnormal CT with high calcium score  Cath resulted in stenting of RCA  IMPRESSIONS:  1. Normal left main coronary artery. 2. Mild atherosclerosis in the left anterior descending artery and its branches. 3. Mild atherosclerosis in the left circumflex artery and its branches. 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 Moderately diseased vessel diffusely. Successful bare-metal stent placement to the mid right coronary artery with a 2.0 x 15 mini vision stent, postdilated to 2.6 mm in diameter. We used a bare-metal stent due to the patient's recent history of rectal bleeding and need for a GI workup. I did not want to commit him to long-term dual antiplatelet therapy. That is also why we did not cover the entire area of disease in the mid right coronary artery.  4. Normal left ventricular systolic function. LVEDP 20 mmHg. Ejection fraction 60 %.   No chest pain.  Depressed about home life Wife of 37 years not getting along with.  Raising 2 yo grandchild.  Wife has chronic back issues and he's doing all the cooking And house choir   ROS: Denies fever, malais, weight loss, blurry vision, decreased visual acuity, cough, sputum, SOB, hemoptysis, pleuritic pain, palpitaitons, heartburn, abdominal pain, melena, lower extremity edema, claudication, or rash.  All other systems reviewed and negative  General: Affect appropriate Healthy:  appears stated age 58: normal Neck supple with no adenopathy JVP normal no bruits no thyromegaly Lungs clear with no wheezing and good diaphragmatic motion Heart:  S1/S2 no murmur, no rub, gallop or click PMI normal Abdomen: benighn, BS positve, no tenderness, no AAA no bruit.  No HSM or HJR Distal pulses intact with no bruits No edema Neuro non-focal Skin warm and dry No muscular weakness   Current Outpatient Prescriptions   Medication Sig Dispense Refill  . ACTONEL 35 MG tablet take 1 tablet by mouth every week  4 tablet  5  . ALPRAZolam (XANAX) 1 MG tablet take 1 tablet by mouth three times a day if needed  90 tablet  5  . amitriptyline (ELAVIL) 50 MG tablet Take 1 tablet (50 mg total) by mouth at bedtime.  90 tablet  3  . aspirin EC 81 MG tablet Take 81 mg by mouth daily.      . baclofen (LIORESAL) 10 MG tablet 1/2 to 1 tid prn muscle spasm  90 tablet  5  . clopidogrel (PLAVIX) 75 MG tablet Take 1 tablet (75 mg total) by mouth daily with breakfast.  30 tablet  6  . gabapentin (NEURONTIN) 300 MG capsule Take 1-2 capsules (300-600 mg total) by mouth 3 (three) times daily. Takes 2 in the morning 1 at lunch and 2 at night  90 capsule  11  . lisinopril (PRINIVIL,ZESTRIL) 10 MG tablet Take 1 tablet (10 mg total) by mouth daily.  90 tablet  0  . nitroGLYCERIN (NITROSTAT) 0.4 MG SL tablet Place 1 tablet (0.4 mg total) under the tongue every 5 (five) minutes as needed for chest pain.  25 tablet  3  . omega-3 acid ethyl esters (LOVAZA) 1 G capsule Take 2 capsules (2 g total) by mouth 2 (two) times daily.  120 capsule  3  . Oxycodone-Acetaminophen (ROXICET PO) Take by mouth. 10mg  1 qid      . simvastatin (ZOCOR) 40 MG tablet Take 1 tablet (40 mg total) by  mouth every evening.  90 tablet  3   No current facility-administered medications for this visit.    Allergies  Hydrocodone-acetaminophen  Electrocardiogram:  SR rate 86 ICRBBB   Assessment and Plan

## 2013-08-19 NOTE — Assessment & Plan Note (Signed)
Cholesterol is at goal.  Continue current dose of statin and diet Rx.  No myalgias or side effects.  F/U  LFT's in 6 months. Lab Results  Component Value Date   LDLCALC 79 02/13/2013

## 2013-08-19 NOTE — Patient Instructions (Signed)
Your physician wants you to follow-up in: JAN  2016 [place] will receive a reminder letter in the mail two months in advance. If you don't receive a letter, please call our office to schedule the follow-up appointment. Your physician recommends that you continue on your current medications as directed. Please refer to the Current Medication list given to you today.

## 2013-08-19 NOTE — Assessment & Plan Note (Signed)
Well controlled.  Continue current medications and low sodium Dash type diet.    

## 2013-08-19 NOTE — Assessment & Plan Note (Signed)
Stable with no angina and good activity level.  Continue medical Rx D/C Plavix 1/16

## 2013-08-19 NOTE — Assessment & Plan Note (Signed)
No complications from stopping plavix prior to colonoscopy.  F/U endo 5 years

## 2013-09-24 ENCOUNTER — Telehealth: Payer: Self-pay | Admitting: Cardiovascular Disease

## 2013-09-24 NOTE — Telephone Encounter (Signed)
Patient had stint placed in his heart. He would like to know what mg Aspirin to take? He was  Also given a script for Cialis he wanted to make sure it was okay to take? Please call and advise.

## 2013-09-24 NOTE — Telephone Encounter (Signed)
Called wanting to know what dose of ASA he should be taking and that he has a Rx for Cialis and wants to know if he can take. Advised that he should be taking ASA 81 mg and is OK to take Cialis as long as he doesn't take the NTG. States he has never used the NTG. Advised to make sure he doesn't take the NTG and Cialis.

## 2013-09-26 ENCOUNTER — Telehealth: Payer: Self-pay

## 2013-09-26 NOTE — Telephone Encounter (Signed)
PA denied. Medicare does not cover any medications for ED. Notified pharm.

## 2013-09-26 NOTE — Telephone Encounter (Signed)
PA needed for cialis. Called pt who advised he has not ever tried any other ED medications, but he is willing to if ins covers a different kind as long as it is as safe for his heart as the Cialis.

## 2013-09-26 NOTE — Telephone Encounter (Signed)
Completed form on covermymeds.

## 2013-10-07 ENCOUNTER — Emergency Department (HOSPITAL_COMMUNITY)
Admission: EM | Admit: 2013-10-07 | Discharge: 2013-10-08 | Disposition: A | Discharge status: Home / Self Care | Payer: Medicare Other | Attending: Emergency Medicine | Admitting: Emergency Medicine

## 2013-10-07 ENCOUNTER — Emergency Department (HOSPITAL_COMMUNITY): Payer: Medicare Other

## 2013-10-07 ENCOUNTER — Encounter (HOSPITAL_COMMUNITY): Payer: Self-pay | Admitting: Emergency Medicine

## 2013-10-07 DIAGNOSIS — E785 Hyperlipidemia, unspecified: Secondary | ICD-10-CM | POA: Insufficient documentation

## 2013-10-07 DIAGNOSIS — G8929 Other chronic pain: Secondary | ICD-10-CM | POA: Insufficient documentation

## 2013-10-07 DIAGNOSIS — M129 Arthropathy, unspecified: Secondary | ICD-10-CM | POA: Insufficient documentation

## 2013-10-07 DIAGNOSIS — Z9889 Other specified postprocedural states: Secondary | ICD-10-CM | POA: Insufficient documentation

## 2013-10-07 DIAGNOSIS — Z7982 Long term (current) use of aspirin: Secondary | ICD-10-CM | POA: Insufficient documentation

## 2013-10-07 DIAGNOSIS — R0789 Other chest pain: Secondary | ICD-10-CM | POA: Insufficient documentation

## 2013-10-07 DIAGNOSIS — Z7902 Long term (current) use of antithrombotics/antiplatelets: Secondary | ICD-10-CM | POA: Insufficient documentation

## 2013-10-07 DIAGNOSIS — F329 Major depressive disorder, single episode, unspecified: Secondary | ICD-10-CM | POA: Insufficient documentation

## 2013-10-07 DIAGNOSIS — F17200 Nicotine dependence, unspecified, uncomplicated: Secondary | ICD-10-CM | POA: Insufficient documentation

## 2013-10-07 DIAGNOSIS — F3289 Other specified depressive episodes: Secondary | ICD-10-CM | POA: Insufficient documentation

## 2013-10-07 DIAGNOSIS — Z9861 Coronary angioplasty status: Secondary | ICD-10-CM | POA: Insufficient documentation

## 2013-10-07 DIAGNOSIS — Z79899 Other long term (current) drug therapy: Secondary | ICD-10-CM | POA: Insufficient documentation

## 2013-10-07 DIAGNOSIS — I2510 Atherosclerotic heart disease of native coronary artery without angina pectoris: Secondary | ICD-10-CM | POA: Insufficient documentation

## 2013-10-07 DIAGNOSIS — I10 Essential (primary) hypertension: Secondary | ICD-10-CM | POA: Insufficient documentation

## 2013-10-07 LAB — BASIC METABOLIC PANEL
BUN: 14 mg/dL (ref 6–23)
CALCIUM: 9.4 mg/dL (ref 8.4–10.5)
CO2: 24 meq/L (ref 19–32)
CREATININE: 0.98 mg/dL (ref 0.50–1.35)
Chloride: 102 mEq/L (ref 96–112)
GFR calc Af Amer: >90 mL/min (ref >90)
GFR, EST NON AFRICAN AMERICAN: 89 mL/min — AB (ref >90)
GLUCOSE: 105 mg/dL — AB (ref 70–99)
Potassium: 3.6 mEq/L — ABNORMAL LOW (ref 3.7–5.3)
Sodium: 139 mEq/L (ref 137–147)

## 2013-10-07 LAB — CBC WITH DIFFERENTIAL/PLATELET
Basophils Absolute: 0 10*3/uL (ref 0.0–0.1)
Basophils Relative: 1 % (ref 0–1)
EOS ABS: 0.5 10*3/uL (ref 0.0–0.7)
EOS PCT: 9 % — AB (ref 0–5)
HEMATOCRIT: 41.2 % (ref 39.0–52.0)
Hemoglobin: 14.1 g/dL (ref 13.0–17.0)
LYMPHS ABS: 1.8 10*3/uL (ref 0.7–4.0)
LYMPHS PCT: 34 % (ref 12–46)
MCH: 31.3 pg (ref 26.0–34.0)
MCHC: 34.2 g/dL (ref 30.0–36.0)
MCV: 91.6 fL (ref 78.0–100.0)
MONO ABS: 0.6 10*3/uL (ref 0.1–1.0)
Monocytes Relative: 12 % (ref 3–12)
Neutro Abs: 2.3 10*3/uL (ref 1.7–7.7)
Neutrophils Relative %: 44 % (ref 43–77)
PLATELETS: 255 10*3/uL (ref 150–400)
RBC: 4.5 MIL/uL (ref 4.22–5.81)
RDW: 12.7 % (ref 11.5–15.5)
WBC: 5.1 10*3/uL (ref 4.0–10.5)

## 2013-10-07 LAB — I-STAT TROPONIN, ED
Troponin i, poc: 0 ng/mL (ref 0.00–0.08)
Troponin i, poc: 0.01 ng/mL (ref 0.00–0.08)
Troponin i, poc: 0.01 ng/mL (ref 0.00–0.08)

## 2013-10-07 LAB — I-STAT CHEM 8, ED
BUN: 13 mg/dL (ref 6–23)
CALCIUM ION: 1.23 mmol/L (ref 1.12–1.23)
CREATININE: 1.1 mg/dL (ref 0.50–1.35)
Chloride: 103 mEq/L (ref 96–112)
GLUCOSE: 103 mg/dL — AB (ref 70–99)
HEMATOCRIT: 45 % (ref 39.0–52.0)
HEMOGLOBIN: 15.3 g/dL (ref 13.0–17.0)
Potassium: 3.5 mEq/L — ABNORMAL LOW (ref 3.7–5.3)
Sodium: 142 mEq/L (ref 137–147)
TCO2: 25 mmol/L (ref 0–100)

## 2013-10-07 NOTE — ED Notes (Addendum)
Pt states he has been under a lot of stress today and started having chest pain an hour ago on the left side of chest. Pt has hx of stent placement unable to state when. Pt is alert and oriented,ambulatory. Pt came in with bottle of nitroglycerin sublingual tabs but states he has not taken any.
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
## 2013-10-07 NOTE — ED Notes (Signed)
EKG performed given to DR [employer]

## 2013-10-08 ENCOUNTER — Telehealth: Payer: Self-pay | Admitting: Cardiovascular Disease

## 2013-10-08 NOTE — Telephone Encounter (Signed)
New Message:  Pt states he was seen in the hospital for CP and states he believes it was due to stress... PT wants to know if he should be checekd out by Dr. Johnsie Cancel or not... Pt is requesting to speak with the nurse.

## 2013-10-08 NOTE — Telephone Encounter (Signed)
CALLED PT  RE   MESSAGE  PER PT  HAD  CHEST  PAIN  YESTERDAY  WENT  TO  ER  AND   WAS  TREATED  ,  EVALUATED ,   AND  RELEASED  PER  PT   PAIN  WAS SIMILAR   WHEN  HAD  STENTS   PT  DID NOT  TAKE  ANY  NTG  WITH EPISODE  ENCOURAGED IF  HAPPENS  AGAIN  TO  TRY   TAKING  NTG  ALSO  IF NOTES  WORSENING IN  SYMPTOMS   INCREASED  FREQUENCY   TO  GO TO  ER  FOR  TREATMENT  APPT   MADE   WITH DR NISHAN FOR   6-24 -15  AT   10:15  AM

## 2013-10-08 NOTE — Discharge Instructions (Signed)
Chest Pain (Nonspecific) Call your cardiologist tomorrow to schedule an office visit. Tell office staff that you were seen here. Ask your cardiologist or Dr.Lauenstein to help you to stop smoking It is often hard to give a specific diagnosis for the cause of chest pain. There is always a chance that your pain could be related to something serious, such as a heart attack or a blood clot in the lungs. You need to follow up with your caregiver for further evaluation. CAUSES   Heartburn.  Pneumonia or bronchitis.  Anxiety or stress.  Inflammation around your heart (pericarditis) or lung (pleuritis or pleurisy).  A blood clot in the lung.  A collapsed lung (pneumothorax). It can develop suddenly on its own (spontaneous pneumothorax) or from injury (trauma) to the chest.  Shingles infection (herpes zoster virus). The chest wall is composed of bones, muscles, and cartilage. Any of these can be the source of the pain.  The bones can be bruised by injury.  The muscles or cartilage can be strained by coughing or overwork.  The cartilage can be affected by inflammation and become sore (costochondritis). DIAGNOSIS  Lab tests or other studies, such as X-rays, electrocardiography, stress testing, or cardiac imaging, may be needed to find the cause of your pain.  TREATMENT   Treatment depends on what may be causing your chest pain. Treatment may include:  Acid blockers for heartburn.  Anti-inflammatory medicine.  Pain medicine for inflammatory conditions.  Antibiotics if an infection is present.  You may be advised to change lifestyle habits. This includes stopping smoking and avoiding alcohol, caffeine, and chocolate.  You may be advised to keep your head raised (elevated) when sleeping. This reduces the chance of acid going backward from your stomach into your esophagus.  Most of the time, nonspecific chest pain will improve within 2 to 3 days with rest and mild pain medicine. HOME  CARE INSTRUCTIONS   If antibiotics were prescribed, take your antibiotics as directed. Finish them even if you start to feel better.  For the next few days, avoid physical activities that bring on chest pain. Continue physical activities as directed.  Do not smoke.  Avoid drinking alcohol.  Only take over-the-counter or prescription medicine for pain, discomfort, or fever as directed by your caregiver.  Follow your caregiver's suggestions for further testing if your chest pain does not go away.  Keep any follow-up appointments you made. If you do not go to an appointment, you could develop lasting (chronic) problems with pain. If there is any problem keeping an appointment, you must call to reschedule. SEEK MEDICAL CARE IF:   You think you are having problems from the medicine you are taking. Read your medicine instructions carefully.  Your chest pain does not go away, even after treatment.  You develop a rash with blisters on your chest. SEEK IMMEDIATE MEDICAL CARE IF:   You have increased chest pain or pain that spreads to your arm, neck, jaw, back, or abdomen.  You develop shortness of breath, an increasing cough, or you are coughing up blood.  You have severe back or abdominal pain, feel nauseous, or vomit.  You develop severe weakness, fainting, or chills.  You have a fever. THIS IS AN EMERGENCY. Do not wait to see if the pain will go away. Get medical help at once. Call your local emergency services (911 in [place] Do not drive yourself to the hospital. MAKE SURE YOU:   Understand these instructions.  Will watch your condition.  Will get help right away if you are not doing well or get worse. Document Released: 01/19/2005 Document Revised: 07/04/2011 Document Reviewed: 11/15/2007 [facility] Patient Information 2014 [place].

## 2013-10-15 ENCOUNTER — Other Ambulatory Visit: Payer: Self-pay | Admitting: Family Medicine

## 2013-10-16 ENCOUNTER — Ambulatory Visit (INDEPENDENT_AMBULATORY_CARE_PROVIDER_SITE_OTHER): Payer: Medicare Other | Admitting: Cardiovascular Disease

## 2013-10-16 ENCOUNTER — Encounter: Payer: Self-pay | Admitting: Cardiovascular Disease

## 2013-10-16 VITALS — BP 118/76 | HR 72 | Ht 66.0 in | Wt 201.0 lb

## 2013-10-16 DIAGNOSIS — I25119 Atherosclerotic heart disease of native coronary artery with unspecified angina pectoris: Secondary | ICD-10-CM

## 2013-10-16 DIAGNOSIS — R079 Chest pain, unspecified: Secondary | ICD-10-CM

## 2013-10-16 DIAGNOSIS — I2510 Atherosclerotic heart disease of native coronary artery without angina pectoris: Secondary | ICD-10-CM

## 2013-10-16 DIAGNOSIS — Z95820 Peripheral vascular angioplasty status with implants and grafts: Secondary | ICD-10-CM

## 2013-10-16 DIAGNOSIS — I209 Angina pectoris, unspecified: Secondary | ICD-10-CM

## 2013-10-16 DIAGNOSIS — E7800 Pure hypercholesterolemia, unspecified: Secondary | ICD-10-CM

## 2013-10-16 DIAGNOSIS — Z79899 Other long term (current) drug therapy: Secondary | ICD-10-CM

## 2013-10-16 DIAGNOSIS — Z9889 Other specified postprocedural states: Secondary | ICD-10-CM

## 2013-10-16 NOTE — Patient Instructions (Addendum)
Your physician recommends that you schedule a follow-up appointment in:   [place] Your physician recommends that you continue on your current medications as directed. Please refer to the Current Medication list given to you today. Your physician has requested that you have a lexiscan myoview. For further information please visit [email or web address]. Please follow instruction sheet, as given.   Your physician recommends that you return for lab work in: [place]

## 2013-10-16 NOTE — Assessment & Plan Note (Signed)
Recurrent pain with ER visit Previous stent to RCA only 71mm stent.  Baseline abnormal ECG  F/U stress myovue  Encouraged hime to take nitro if recurs  Continue asa and plavix

## 2013-10-16 NOTE — Progress Notes (Signed)
Patient ID: Jeremiah Goodwin, male   DOB: March 30, 1956, 58 y.o.   MRN: [id number] 58 yo f/u CAD Hospitalized 1/15 with angina Abnormal CT with high calcium score Cath resulted in stenting of RCA  IMPRESSIONS:  1. Normal left main coronary artery. 2. Mild atherosclerosis in the left anterior descending artery and its branches. 3. Mild atherosclerosis in the left circumflex artery and its branches. 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 Moderately diseased vessel diffusely. Successful bare-metal stent placement to the mid right coronary artery with a 2.0 x 15 mini vision stent, postdilated to 2.6 mm in diameter. We used a bare-metal stent due to the patient's recent history of rectal bleeding and need for a GI workup. I did not want to commit him to long-term dual antiplatelet therapy. That is also why we did not cover the entire area of disease in the mid right coronary artery.  4. Normal left ventricular systolic function. LVEDP 20 mmHg. Ejection fraction 60 %.  Has restraining order out on ex  Girlfriend Jeremiah Goodwin with him today and they seem to get along better In ER 6/15 with somewhat atypical chest pain r/o  Had more pain this week and last night Sharp left precordial pain some dypsnea  Not exertional did not take nitro    ROS: Denies fever, malais, weight loss, blurry vision, decreased visual acuity, cough, sputum, SOB, hemoptysis, pleuritic pain, palpitaitons, heartburn, abdominal pain, melena, lower extremity edema, claudication, or rash.  All other systems reviewed and negative  General: Affect appropriate Healthy:  appears stated age 58: normal Neck supple with no adenopathy JVP normal no bruits no thyromegaly Lungs clear with no wheezing and good diaphragmatic motion Heart:  S1/S2 no murmur, no rub, gallop or click PMI normal Abdomen: benighn, BS positve, no tenderness, no AAA no bruit.  No HSM or HJR Distal pulses intact with no bruits No edema Neuro non-focal Skin warm and  dry No muscular weakness   Current Outpatient Prescriptions  Medication Sig Dispense Refill  . ALPRAZolam (XANAX) 1 MG tablet Take 1 mg by mouth 3 (three) times daily as needed for anxiety.      Marland Kitchen amitriptyline (ELAVIL) 50 MG tablet Take 50 mg by mouth daily as needed for sleep.      Marland Kitchen aspirin EC 81 MG tablet Take 81 mg by mouth at bedtime.       . baclofen (LIORESAL) 10 MG tablet Take 5-10 mg by mouth 3 (three) times daily as needed for muscle spasms.      . clopidogrel (PLAVIX) 75 MG tablet Take 1 tablet (75 mg total) by mouth daily with breakfast.  30 tablet  6  . gabapentin (NEURONTIN) 300 MG capsule Take 1-2 capsules (300-600 mg total) by mouth 3 (three) times daily. Takes 2 in the morning 1 at lunch and 2 at night  90 capsule  11  . lisinopril (PRINIVIL,ZESTRIL) 10 MG tablet Take 1 tablet (10 mg total) by mouth daily.  90 tablet  0  . nitroGLYCERIN (NITROSTAT) 0.4 MG SL tablet Place 1 tablet (0.4 mg total) under the tongue every 5 (five) minutes as needed for chest pain.  25 tablet  3  . omega-3 acid ethyl esters (LOVAZA) 1 G capsule Take 2 capsules (2 g total) by mouth 2 (two) times daily.  120 capsule  3  . Oxycodone HCl 10 MG TABS Take 1 tablet by mouth every 6 (six) hours as needed (pain).       . risedronate (ACTONEL)  35 MG tablet Take 35 mg by mouth every 7 (seven) days. On Mondays, with water on empty stomach, nothing by mouth or lie down for next 30 minutes.      . simvastatin (ZOCOR) 40 MG tablet Take 1 tablet (40 mg total) by mouth every evening.  90 tablet  3   No current facility-administered medications for this visit.    Allergies  Hydrocodone-acetaminophen  Electrocardiogram:  SR ICRBBB rate 72 today  Assessment and Plan

## 2013-10-16 NOTE — Assessment & Plan Note (Addendum)
Cholesterol is at goal.  Continue current dose of statin and diet Rx.  No myalgias or side effects.  F/U  LFT's in 6 months. Lab Results  Component Value Date   LDLCALC 79 02/13/2013   Check fasting labs when he comes for stress test

## 2013-10-18 ENCOUNTER — Other Ambulatory Visit: Payer: Self-pay | Admitting: Family Medicine

## 2013-10-23 ENCOUNTER — Ambulatory Visit (HOSPITAL_COMMUNITY): Payer: Medicare Other | Attending: Cardiology | Admitting: Radiology

## 2013-10-23 ENCOUNTER — Other Ambulatory Visit (INDEPENDENT_AMBULATORY_CARE_PROVIDER_SITE_OTHER): Payer: Medicare Other

## 2013-10-23 VITALS — BP 102/75 | HR 65 | Ht 66.0 in | Wt 196.0 lb

## 2013-10-23 DIAGNOSIS — E7800 Pure hypercholesterolemia, unspecified: Secondary | ICD-10-CM

## 2013-10-23 DIAGNOSIS — R079 Chest pain, unspecified: Secondary | ICD-10-CM

## 2013-10-23 DIAGNOSIS — R0602 Shortness of breath: Secondary | ICD-10-CM | POA: Insufficient documentation

## 2013-10-23 DIAGNOSIS — Z95820 Peripheral vascular angioplasty status with implants and grafts: Secondary | ICD-10-CM

## 2013-10-23 DIAGNOSIS — I2510 Atherosclerotic heart disease of native coronary artery without angina pectoris: Secondary | ICD-10-CM

## 2013-10-23 DIAGNOSIS — Z79899 Other long term (current) drug therapy: Secondary | ICD-10-CM

## 2013-10-23 LAB — HEPATIC FUNCTION PANEL
ALBUMIN: 3.8 g/dL (ref 3.5–5.2)
ALK PHOS: 68 U/L (ref 39–117)
ALT: 16 U/L (ref 0–53)
AST: 18 U/L (ref 0–37)
Bilirubin, Direct: 0 mg/dL (ref 0.0–0.3)
Total Bilirubin: 0.4 mg/dL (ref 0.2–1.2)
Total Protein: 7.1 g/dL (ref 6.0–8.3)

## 2013-10-23 LAB — LIPID PANEL
CHOL/HDL RATIO: 2
Cholesterol: 146 mg/dL (ref 0–200)
HDL: 67.4 mg/dL (ref >39.00)
LDL Cholesterol: 63 mg/dL (ref 0–99)
NONHDL: 78.6
Triglycerides: 80 mg/dL (ref 0.0–149.0)
VLDL: 16 mg/dL (ref 0.0–40.0)

## 2013-10-23 MED ORDER — TECHNETIUM TC 99M SESTAMIBI GENERIC - CARDIOLITE
10.0000 | Freq: Once | INTRAVENOUS | Status: AC | PRN
  Administered 2013-10-23: 10 via INTRAVENOUS

## 2013-10-23 MED ORDER — REGADENOSON 0.4 MG/5ML IV SOLN
0.4000 mg | Freq: Once | INTRAVENOUS | Status: AC
  Administered 2013-10-23: 0.4 mg via INTRAVENOUS

## 2013-10-23 MED ORDER — TECHNETIUM TC 99M SESTAMIBI GENERIC - CARDIOLITE
30.0000 | Freq: Once | INTRAVENOUS | Status: AC | PRN
  Administered 2013-10-23: 30 via INTRAVENOUS

## 2013-10-23 NOTE — Progress Notes (Signed)
[facility] [address] [place], [place] [address] [phone]    Cardiology Nuclear Med Study  Jeremiah Goodwin is a 58 y.o. male     MRN : [id number]     DOB: 12-28-1955  Procedure Date: 10/23/2013  Nuclear Med Background Indication for Stress Test:  Evaluation for Ischemia, Stent Patency and [facility] 6/15 CP R/O MI History:  CAD, Cath, Stent (RCA) MPI 2010 EF 69% (normal), Asthma Cardiac Risk Factors: Hypertension, Lipids and Smoker  Symptoms:  Chest Pain and SOB   Nuclear Pre-Procedure Caffeine/Decaff Intake:  None NPO After: 7:00pm   Lungs:  clear O2 Sat: 98% on room air. IV 0.9% NS with Angio Cath:  22g  IV Site: R Hand  IV Started by:  Crissie Figures, RN  Chest Size (in):  44 Cup Size: n/a  Height: 5\' 6"  (1.676 m)  Weight:  196 lb (88.905 kg)  BMI:  Body mass index is 31.65 kg/(m^2). Tech Comments:  N/A    Nuclear Med Study 1 or 2 day study: 1 day  Stress Test Type:  Lexiscan  Reading MD: N/A  Order Authorizing Provider:  Jenkins Rouge, MD  Resting Radionuclide: Technetium 16m Sestamibi  Resting Radionuclide Dose: 11.0 mCi   Stress Radionuclide:  Technetium 26m Sestamibi  Stress Radionuclide Dose: 33.0 mCi           Stress Protocol Rest HR: 65 Stress HR: 92  Rest BP: 102/75 Stress BP: 105/82  Exercise Time (min): n/a METS: n/a           Dose of Adenosine (mg):  n/a Dose of Lexiscan: 0.4 mg  Dose of Atropine (mg): n/a Dose of Dobutamine: n/a mcg/kg/min (at max HR)  Stress Test Technologist: Glade Lloyd, BS-ES  Nuclear Technologist:  Charlton Amor, CNMT     Rest Procedure:  Myocardial perfusion imaging was performed at rest 45 minutes following the intravenous administration of Technetium 69m Sestamibi. Rest ECG: Normal sinus rhythm with right bundle branch block  Stress Procedure:  The patient received IV Lexiscan 0.4 mg over 15-seconds.  Technetium 63m Sestamibi injected at 30-seconds.  Quantitative spect images were  obtained after a 45 minute delay.  During the infusion of Lexiscan the patient complained of SOB and feeling flushed.  These symptoms began to resolve in recovery.  Stress ECG: No significant change from baseline ECG  QPS Raw Data Images:  Normal; no motion artifact; normal heart/lung ratio. Stress Images:  Small/medium area of mild decreased uptake affecting the apical cap, apical inferior segment, apical lateral segment, mid inferolateral segment.  This area is partially reversible Rest Images:  Small area of mild decreased uptake affecting the mid inferolateral wall and apical lateral segment. Subtraction (SDS):  Very mild reversibility Transient Ischemic Dilatation (Normal <1.22):  1.06 Lung/Heart Ratio (Normal <0.45):  0.26  Quantitative Gated Spect Images QGS EDV:  107 ml QGS ESV:  59 ml  Impression Exercise Capacity:  Lexiscan with no exercise. BP Response:  Normal blood pressure response. Clinical Symptoms:  Shortness of breath ECG Impression:  No significant ST segment change suggestive of ischemia. Comparison with Prior Nuclear Study: This study is compared with the report of the study from March, 2010  Overall Impression:  There is suggestion of some change since March, 2010. In the past wall motion was normal and there were no definite areas of ischemia. Currently there is an area that may represent a small area of scar with mild ischemia. The scar is  in the mid inferolateral segment and lateral apical segment. The ischemia is in the apical inferior segment and apical cap. This is a mild abnormality. Overall this is a low risk scan.  LV Ejection Fraction: 45%.  LV Wall Motion:  There is mild reduction in the overall ejection fraction. There may be mild hypokinesis near the inferolateral apex.  Dola Argyle, MD

## 2013-11-14 ENCOUNTER — Other Ambulatory Visit: Payer: Self-pay | Admitting: Family Medicine

## 2013-11-18 ENCOUNTER — Other Ambulatory Visit: Payer: Self-pay | Admitting: Family Medicine

## 2013-11-20 ENCOUNTER — Ambulatory Visit (INDEPENDENT_AMBULATORY_CARE_PROVIDER_SITE_OTHER): Payer: Medicare Other | Admitting: Family Medicine

## 2013-11-20 VITALS — BP 136/72 | HR 72 | Temp 98.4°F | Resp 20 | Ht 66.0 in | Wt 202.6 lb

## 2013-11-20 DIAGNOSIS — N529 Male erectile dysfunction, unspecified: Secondary | ICD-10-CM

## 2013-11-20 DIAGNOSIS — N521 Erectile dysfunction due to diseases classified elsewhere: Secondary | ICD-10-CM

## 2013-11-20 DIAGNOSIS — M545 Low back pain, unspecified: Secondary | ICD-10-CM

## 2013-11-20 DIAGNOSIS — E785 Hyperlipidemia, unspecified: Secondary | ICD-10-CM

## 2013-11-20 DIAGNOSIS — F411 Generalized anxiety disorder: Secondary | ICD-10-CM

## 2013-11-20 DIAGNOSIS — I10 Essential (primary) hypertension: Secondary | ICD-10-CM

## 2013-11-20 MED ORDER — TADALAFIL 20 MG PO TABS: 10.0000 mg | ORAL_TABLET | ORAL | Status: DC | PRN

## 2013-11-20 MED ORDER — LISINOPRIL 10 MG PO TABS: 10.0000 mg | ORAL_TABLET | Freq: Every day | ORAL | Status: DC

## 2013-11-20 MED ORDER — SIMVASTATIN 40 MG PO TABS: 40.0000 mg | ORAL_TABLET | Freq: Every evening | ORAL | Status: DC

## 2013-11-20 MED ORDER — KETOROLAC TROMETHAMINE 60 MG/2ML IM SOLN
60.0000 mg | Freq: Once | INTRAMUSCULAR | Status: AC
  Administered 2013-11-20: 60 mg via INTRAMUSCULAR

## 2013-11-20 MED ORDER — ALPRAZOLAM 1 MG PO TABS: 1.0000 mg | ORAL_TABLET | Freq: Three times a day (TID) | ORAL | Status: DC | PRN

## 2013-11-20 MED ORDER — SILDENAFIL CITRATE 100 MG PO TABS: 50.0000 mg | ORAL_TABLET | Freq: Every day | ORAL | Status: DC | PRN

## 2013-11-20 NOTE — Patient Instructions (Signed)

## 2013-11-20 NOTE — Progress Notes (Signed)
Subjective:  This chart was scribed for Jeremiah Haber, MD by Thea Alken, ED Scribe. This patient was seen in room 8 and the patient's care was started at 8:26 PM.   Patient ID: Jeremiah Goodwin, male    DOB: [birth year], 58 y.o.   MRN: [id number]  HPI Chief Complaint  Patient presents with  . Follow-up    pt has some issues to discuss with [name]   HPI Comments: Jeremiah Goodwin is a 58 y.o. male who presents to the [facility] for a follow up regarding back pain. Pt reports pain with walking. Pt is out of simvastatin and lisinopril.  Pt states he is seen at the pain clinic monthly and receives injections in his back with mild relief to pain. Pt denies SOB. Pt smoker 3 cigarettes daily. Pt has been doing water exercises at the [facility] but has not been in a while.  Pt is requesting cialis. Pt states his "performance is weak".  Pt works part time at [employer]. Pt reports he left his wife after 36 years.   Past Medical History  Diagnosis Date  . Hyperlipidemia   . Chronic pain syndrome     Knees to feet and low back, on Neurontin.  Marland Kitchen Hypertension   . Allergy   . Osteoporosis   . Arthritis   . Depression   . CAD (coronary artery disease)     a. 04/2013 Card CTA: mixed plaque in RCA/LAD, Ca 344;  b. 04/2013 Cath: LM nl, LAD min irregs, D3 50ost, LCX and OM's nl, RCA 5m (2.0x15 Vision BMS), EF 60%.   Past Surgical History  Procedure Laterality Date  . Spine surgery      Has had surgery spine and low back  . Joint replacement      Left total knee arthroplasty  . Back surgery      8 different times  . Hemorrhoid surgery    . Total hip arthroplasty  05/04/2012    Procedure: TOTAL HIP ARTHROPLASTY;  Surgeon: Gearlean Alf, MD;  Location: [facility]S;  Service: Orthopedics;  Laterality: Left;  . Cardiac catheterization    . Coronary angioplasty with stent placement     Prior to Admission medications   Medication Sig Start Date End Date Taking? Authorizing  Provider  ALPRAZolam Duanne Moron) 1 MG tablet Take 1 mg by mouth 3 (three) times daily as needed for anxiety.   Yes Historical Provider, MD  amitriptyline (ELAVIL) 50 MG tablet Take 50 mg by mouth daily as needed for sleep.   Yes Historical Provider, MD  aspirin EC 81 MG tablet Take 81 mg by mouth at bedtime.    Yes Historical Provider, MD  baclofen (LIORESAL) 10 MG tablet Take 5-10 mg by mouth 3 (three) times daily as needed for muscle spasms.   Yes Historical Provider, MD  clopidogrel (PLAVIX) 75 MG tablet Take 1 tablet (75 mg total) by mouth daily with breakfast. 08/09/13  Yes [name], MD  gabapentin (NEURONTIN) 300 MG capsule TAKE 1 TO 2 CAPS 3 TIMES DAILY.... 2 CAPS IN THE AM, 1 CAP AT LUNCH AND 2 CAPS AT NIGHT.   Yes Jeremiah Haber, MD  lisinopril (PRINIVIL,ZESTRIL) 10 MG tablet Take 1 tablet (10 mg total) by mouth daily. PATIENT NEEDS OFFICE VISIT FOR ADDITIONAL REFILLS   Yes Jeremiah Haber, MD  nitroGLYCERIN (NITROSTAT) 0.4 MG SL tablet Place 1 tablet (0.4 mg total) under the tongue every 5 (five) minutes as needed  for chest pain. 05/15/13  Yes [name], NP  omega-3 acid ethyl esters (LOVAZA) 1 G capsule take 2 capsules by mouth twice a day   Yes [name], PA-C  Oxycodone HCl 10 MG TABS Take 1 tablet by mouth every 6 (six) hours as needed (pain).  09/18/13  Yes Historical Provider, MD  risedronate (ACTONEL) 35 MG tablet Take 35 mg by mouth every 7 (seven) days. On Mondays, with water on empty stomach, nothing by mouth or lie down for next 30 minutes.   Yes Historical Provider, MD  simvastatin (ZOCOR) 40 MG tablet Take 1 tablet (40 mg total) by mouth every evening. PATIENT NEEDS OFFICE VISIT FOR ADDITIONAL REFILLS   Yes Jeremiah Haber, MD   Review of Systems  Respiratory: Negative for shortness of breath.   Musculoskeletal: Positive for back pain and myalgias. Negative for gait problem.   Objective:   Physical Exam  Nursing note and vitals reviewed. Constitutional: He  is oriented to person, place, and time. He appears well-developed and well-nourished. No distress.  HENT:  Head: Normocephalic and atraumatic.  Eyes: Conjunctivae and EOM are normal.  Neck: Neck supple.  Cardiovascular: Normal rate.   Pulmonary/Chest: Effort normal.  Musculoskeletal: Normal range of motion.  Neurological: He is alert and oriented to person, place, and time.  Skin: Skin is warm and dry.  Psychiatric: He has a normal mood and affect. His behavior is normal.   Filed Vitals:   11/20/13 2008  BP: 136/72  Pulse: 72  Temp: 98.4 F (36.9 C)  TempSrc: Oral  Resp: 20  Height: 5\' 6"  (1.676 m)  Weight: 202 lb 9.6 oz (91.899 kg)  SpO2: 97%       Assessment & Plan:  Essential hypertension - Plan: lisinopril (PRINIVIL,ZESTRIL) 10 MG tablet  Low back pain, unspecified back pain laterality, with sciatica presence unspecified - Plan: ketorolac (TORADOL) injection 60 mg  Other and unspecified hyperlipidemia - Plan: simvastatin (ZOCOR) 40 MG tablet  Anxiety state, unspecified - Plan: ALPRAZolam Duanne Moron) 1 MG tablet  Signed, Jeremiah Haber, MD

## 2013-11-26 ENCOUNTER — Other Ambulatory Visit: Payer: Self-pay | Admitting: Family Medicine

## 2013-12-02 ENCOUNTER — Telehealth: Payer: Self-pay

## 2013-12-02 MED ORDER — OMEGA-3-ACID ETHYL ESTERS 1 G PO CAPS: ORAL_CAPSULE | ORAL | Status: DC

## 2013-12-02 NOTE — Telephone Encounter (Signed)
Refill sent to pharmacy.   

## 2013-12-02 NOTE — Telephone Encounter (Signed)
Patient says he has been taking lovaza for years. He wants to know why a refill wasn't sent in at his last OV

## 2013-12-09 ENCOUNTER — Telehealth: Payer: Self-pay

## 2013-12-09 NOTE — Telephone Encounter (Signed)
Patient requesting a handicap sticker (permanent). Per patient he had one from our office before but missed placed it. Please call patient when ready at [phone]

## 2013-12-09 NOTE — Telephone Encounter (Signed)
Form in box awaiting completion.

## 2013-12-11 ENCOUNTER — Other Ambulatory Visit: Payer: Self-pay | Admitting: Family Medicine

## 2013-12-11 NOTE — Telephone Encounter (Signed)
[name]- do you have this form ready for pt pick up?

## 2013-12-11 NOTE — Telephone Encounter (Signed)
I filled it out and signed it.  I don't know where it is.

## 2013-12-11 NOTE — Telephone Encounter (Signed)
PATIENT IS CALLING BACK IN REGARDS TO PHONE CALL HE MADE ABOUT A HANDICAP STICKER REPLACEMENT. HE WAS INFORMED THAT ISN'T READY YET

## 2013-12-12 NOTE — Telephone Encounter (Signed)
Filled out another form, [name] signed and in pick up drawer for pt.  Notified pt.

## 2013-12-12 NOTE — Telephone Encounter (Signed)
Dr L, I don't see that you have Rxd this for some time and it looks like it has been Rxd by another provider also. Do you manage this for pt and want to RF?

## 2014-01-04 ENCOUNTER — Ambulatory Visit (INDEPENDENT_AMBULATORY_CARE_PROVIDER_SITE_OTHER): Payer: Medicare Other | Admitting: Family Medicine

## 2014-01-04 VITALS — BP 122/66 | HR 67 | Temp 97.7°F | Resp 16 | Ht 66.0 in | Wt 200.0 lb

## 2014-01-04 DIAGNOSIS — N529 Male erectile dysfunction, unspecified: Secondary | ICD-10-CM

## 2014-01-04 DIAGNOSIS — F411 Generalized anxiety disorder: Secondary | ICD-10-CM

## 2014-01-04 DIAGNOSIS — N522 Drug-induced erectile dysfunction: Secondary | ICD-10-CM

## 2014-01-04 MED ORDER — TADALAFIL 5 MG PO TABS: 5.0000 mg | ORAL_TABLET | Freq: Every day | ORAL | Status: DC | PRN

## 2014-01-04 MED ORDER — ALPRAZOLAM 1 MG PO TABS: 1.0000 mg | ORAL_TABLET | Freq: Three times a day (TID) | ORAL | Status: DC | PRN

## 2014-01-04 NOTE — Patient Instructions (Addendum)
No more baclofen, neurontin, actonel, simvastatin  Continue with red yeast rice, fish oil, lisinopril, elavil as needed at night, Plavix, aspirin xanax

## 2014-01-04 NOTE — Progress Notes (Addendum)
Subjective:  This chart was scribed for [name], MD by Mercy Moore, Medial Scribe. This patient was seen in room 12 and the patient's care was started at 9:49 AM.   Patient ID: Jeremiah Goodwin, male    DOB: 07-17-1955, 58 y.o.   MRN: [id number]  HPI HPI Comments: Jeremiah Goodwin is a 58 y.o. male who presents to the [facility] requesting medication refill.  Having erectile dysfunction with inability to maintain an erection.  Has a new girlfriend.  Works delivering cars.  Still smoking.  Disabled.   Patient Active Problem List   Diagnosis Date Noted   Polyp of colon 08/19/2013   CAD (coronary artery disease)    OA (osteoarthritis) of hip 05/04/2012   HTN (hypertension) 03/16/2012   Chronic pain syndrome 05/30/2011   Cervical disc disease 05/30/2011   Lumbar disc disease 05/30/2011   OBESITY 09/01/2008   HYPERCHOLESTEROLEMIA 08/20/2008   GASTROESOPHAGEAL REFLUX DISEASE 07/11/2008   DYSPHAGIA UNSPECIFIED 07/11/2008   TOBACCO ABUSE 06/18/2008   BACK PAIN, CHRONIC 06/18/2008   SLEEP DISORDER 06/18/2008   TOTAL KNEE REPLACEMENT, LEFT, HX OF 04/25/2005   ERECTILE DYSFUNCTION 05/10/2003   ANXIETY 02/07/2003   OSTEOPOROSIS 01/10/2003   Other postprocedural status 08/24/2002   Past Medical History  Diagnosis Date   Hyperlipidemia    Chronic pain syndrome     Knees to feet and low back, on Neurontin.   Hypertension    Allergy    Osteoporosis    Arthritis    Depression    CAD (coronary artery disease)     a. 04/2013 Card CTA: mixed plaque in RCA/LAD, Ca 344;  b. 04/2013 Cath: LM nl, LAD min irregs, D3 50ost, LCX and OM's nl, RCA 30m (2.0x15 Vision BMS), EF 60%.   Past Surgical History  Procedure Laterality Date   Spine surgery      Has had surgery spine and low back   Joint replacement      Left total knee arthroplasty   Back surgery      8 different times   Hemorrhoid surgery     Total hip arthroplasty  05/04/2012   Procedure: TOTAL HIP ARTHROPLASTY;  Surgeon: Gearlean Alf, MD;  Location: [facility];  Service: Orthopedics;  Laterality: Left;   Cardiac catheterization     Coronary angioplasty with stent placement     Allergies  Allergen Reactions   Hydrocodone-Acetaminophen Itching    REACTION: itching   Prior to Admission medications   Medication Sig Start Date End Date Taking? Authorizing Provider  ACTONEL 35 MG tablet take 1 tablet by mouth every week 12/13/13  Yes [name], MD  ALPRAZolam Duanne Moron) 1 MG tablet Take 1 tablet (1 mg total) by mouth 3 (three) times daily as needed for anxiety. 11/20/13  Yes [name], MD  amitriptyline (ELAVIL) 50 MG tablet Take 50 mg by mouth daily as needed for sleep.   Yes Historical Provider, MD  aspirin EC 81 MG tablet Take 81 mg by mouth at bedtime.    Yes Historical Provider, MD  baclofen (LIORESAL) 10 MG tablet Take 5-10 mg by mouth 3 (three) times daily as needed for muscle spasms.   Yes Historical Provider, MD  clopidogrel (PLAVIX) 75 MG tablet Take 1 tablet (75 mg total) by mouth daily with breakfast. 08/09/13  Yes [name], MD  gabapentin (NEURONTIN) 300 MG capsule Take 1 to 2 caps by mouth 3 times daily, 2 caps in AM, 1 at lunch,  and 2 at night. 11/27/13  Yes [name], MD  lisinopril (PRINIVIL,ZESTRIL) 10 MG tablet take 1 tablet by mouth once daily 12/12/13  Yes [name], MD  nitroGLYCERIN (NITROSTAT) 0.4 MG SL tablet Place 1 tablet (0.4 mg total) under the tongue every 5 (five) minutes as needed for chest pain. 05/15/13  Yes [name], NP  omega-3 acid ethyl esters (LOVAZA) 1 G capsule take 2 capsules by mouth twice a day 12/02/13  Yes Chelle S Jeffery, PA-C  Oxycodone HCl 10 MG TABS Take 1 tablet by mouth every 6 (six) hours as needed (pain).  09/18/13  Yes Historical Provider, MD  risedronate (ACTONEL) 35 MG tablet Take 35 mg by mouth every 7 (seven) days. On Mondays, with water on empty stomach, nothing by mouth or lie down  for next 30 minutes.   Yes Historical Provider, MD  sildenafil (VIAGRA) 100 MG tablet Take 0.5-1 tablets (50-100 mg total) by mouth daily as needed for erectile dysfunction. 11/20/13  Yes [name], MD  simvastatin (ZOCOR) 40 MG tablet take 1 tablet by mouth every evening 12/12/13  Yes [name], MD   History   Social History   Marital Status: Divorced    Spouse Name: N/A    Number of Children: N/A   Years of Education: N/A   Occupational History   Unemployed    Social History Main Topics   Smoking status: Current Every Day Smoker -- 0.33 packs/day for 42 years    Types: Cigarettes   Smokeless tobacco: Never Used     Comment: PATIENT IS TRYING TO QUIT HE SAID   Alcohol Use: 0.0 oz/week     Comment: rarely drinks    Drug Use: No   Sexual Activity: No   Other Topics Concern   Not on file   Social History Narrative   Lives in [place].     Review of Systems     Objective:   Physical Exam  Nursing note and vitals reviewed. Constitutional: He appears well-developed and well-nourished.  Skin: He is not diaphoretic.   Chest:  Clear Heart:  Reg:  No murmur Abdomen:  Fullness ruq with no definite liver edge Results for orders placed in visit on 10/23/13  LIPID PANEL      Result Value Ref Range   Cholesterol 146  0 - 200 mg/dL   Triglycerides 80.0  0.0 - 149.0 mg/dL   HDL 67.40  >39.00 mg/dL   VLDL 16.0  0.0 - 40.0 mg/dL   LDL Cholesterol 63  0 - 99 mg/dL   Total CHOL/HDL Ratio 2     NonHDL 78.60    HEPATIC FUNCTION PANEL      Result Value Ref Range   Total Bilirubin 0.4  0.2 - 1.2 mg/dL   Bilirubin, Direct 0.0  0.0 - 0.3 mg/dL   Alkaline Phosphatase 68  39 - 117 U/L   AST 18  0 - 37 U/L   ALT 16  0 - 53 U/L   Total Protein 7.1  6.0 - 8.3 g/dL   Albumin 3.8  3.5 - 5.2 g/dL     Filed Vitals:   01/04/14 0917  BP: 122/66  Pulse: 67  Temp: 97.7 F (36.5 C)  TempSrc: Oral  Resp: 16  Height: 5\' 6"  (1.676 m)  Weight: 200 lb (90.719 kg)    SpO2: 98%    Wt Readings from Last 3 Encounters:  01/04/14 200 lb (90.719 kg)  11/20/13 202 lb 9.6 oz (91.899 kg)  10/23/13 196 lb (88.905 kg)      Assessment & Plan:    I personally performed the services described in this documentation, which was scribed in my presence. The recorded information has been reviewed and is accurate. No more baclofen, neurontin, actonel, simvastatin Continue with red yeast rice, fish oil, lisinopril, elavil as needed at night, Plavix, aspirin. Drug-induced erectile dysfunction - Plan: Ambulatory referral to Urology, tadalafil (CIALIS) 5 MG tablet  Anxiety state, unspecified - Plan: ALPRAZolam Duanne Moron) 1 MG tablet  Signed, [name], MD

## 2014-01-14 ENCOUNTER — Ambulatory Visit: Payer: Medicare Other | Admitting: Cardiovascular Disease

## 2014-01-16 ENCOUNTER — Other Ambulatory Visit: Payer: Self-pay | Admitting: Family Medicine

## 2014-02-04 ENCOUNTER — Telehealth: Payer: Self-pay | Admitting: Cardiovascular Disease

## 2014-02-04 NOTE — Telephone Encounter (Signed)
PER PT  THIS INJECTION IS   FOR  ED   PT  STATES  CANNOT AFFORD  VIAGRA   WILL FORWARD TO  DR  Johnsie Cancel  FOR REVIEW . MAY  PT  [place]

## 2014-02-04 NOTE — Telephone Encounter (Signed)
Has CAD with stent in RCA  Cannot recommend injections for erectile dysfunction

## 2014-02-04 NOTE — Telephone Encounter (Signed)
New message     Patient calling stating he need an injection to penis - penila injection. Due to history stent placement.    [facility].

## 2014-02-04 NOTE — Telephone Encounter (Signed)
PT  NOTIFIED ./CY 

## 2014-02-05 ENCOUNTER — Encounter: Payer: Self-pay | Admitting: Cardiovascular Disease

## 2014-02-12 ENCOUNTER — Other Ambulatory Visit: Payer: Self-pay | Admitting: Family Medicine

## 2014-03-04 ENCOUNTER — Ambulatory Visit (INDEPENDENT_AMBULATORY_CARE_PROVIDER_SITE_OTHER): Payer: Medicare Other | Admitting: Family Medicine

## 2014-03-04 VITALS — BP 118/72 | HR 74 | Temp 98.3°F | Resp 16 | Ht 67.25 in | Wt 206.2 lb

## 2014-03-04 DIAGNOSIS — Z01810 Encounter for preprocedural cardiovascular examination: Secondary | ICD-10-CM

## 2014-03-04 DIAGNOSIS — M5442 Lumbago with sciatica, left side: Secondary | ICD-10-CM

## 2014-03-04 MED ORDER — METHYLPREDNISOLONE ACETATE 80 MG/ML IJ SUSP
80.0000 mg | Freq: Once | INTRAMUSCULAR | Status: AC
  Administered 2014-03-04: 80 mg via INTRAMUSCULAR

## 2014-03-04 NOTE — Progress Notes (Addendum)
° °  Subjective:    Patient ID: Jeremiah Goodwin, male    DOB: 1955/10/01, 58 y.o.   MRN: [id number] This chart was scribed for [name], MD by Zola Button, Medical Scribe. This patient was seen in Room 8 and the patient's care was started at 10:19 AM.    HPI HPI Comments: Jeremiah Goodwin is a 58 y.o. male with a hx of hip replacement, CAD and coronary stents who presents to the [facility] complaining of gradual onset lower back pain. Patient also notes having left hip pain and left knee pain. He notes having muscle spasms often in his lower back. He notes increased pain with cold, rainy weather and states that he can only lay on one side due to the pain. Patient states his medications have not been helping with his pain. He states that muscle relaxer makes him drowsy and puts him to sleep. He has been going to a pain clinic. He has not been to the [facility] for exercise in two months.  Patient also has been having some issues with erectile dysfunction.  Review of Systems  Constitutional: Negative for fatigue.  HENT: Negative for ear discharge.   Eyes: Negative for discharge.  Respiratory: Negative for cough.   Cardiovascular: Negative for chest pain.  Gastrointestinal: Negative for diarrhea.  Genitourinary: Negative for hematuria.  Musculoskeletal: Positive for back pain and arthralgias.  Skin: Negative for rash.  Neurological: Negative for seizures.  Psychiatric/Behavioral: Negative for hallucinations.       Objective:   Physical Exam CONSTITUTIONAL: Well developed/well nourished HEAD: Normocephalic/atraumatic EYES: EOM/PERRL ENMT: Mucous membranes moist NECK: supple no meningeal signs SPINE: entire spine nontender CV: S1/S2 noted, no murmurs/rubs/gallops noted LUNGS: Lungs are clear to auscultation bilaterally, no apparent distress ABDOMEN: soft, nontender, no rebound or guarding GU: no cva tenderness NEURO: Pt is awake/alert, moves all extremitiesx4;tender L4  midline EXTREMITIES: pulses normal, full ROM Normal straight leg raising, no muscle wasting SKIN: warm, color normal PSYCH: no abnormalities of mood noted   Assessment & Plan:   Left-sided low back pain with left-sided sciatica - Plan: Ambulatory referral to Neurosurgery, methylPREDNISolone acetate (DEPO-MEDROL) injection 80 mg  Pre-operative cardiovascular examination - Plan: Ambulatory referral to Cardiology I want a second opinion on the use of injectable meds for erectile dysfunction. I really think that this is safe regarding his heart condition. Signed, [name], MD

## 2014-03-05 ENCOUNTER — Telehealth: Payer: Self-pay | Admitting: Family Medicine

## 2014-03-05 DIAGNOSIS — M545 Low back pain: Secondary | ICD-10-CM

## 2014-03-05 NOTE — Telephone Encounter (Signed)
Please advise if MRI is acceptable to order for this pt.

## 2014-03-05 NOTE — Telephone Encounter (Signed)
Patient states that the pain in his back is getting worse. He wants to get an MRI or something else done. States that the pain is unbearable. Please advise  [phone]

## 2014-03-06 ENCOUNTER — Telehealth: Payer: Self-pay

## 2014-03-06 NOTE — Telephone Encounter (Signed)
Patient called. States he has not heard anything regarding a referral that was supposed to be ordered for him. Please return call and advise. CB # [id number]

## 2014-03-06 NOTE — Telephone Encounter (Signed)
Referrals has sent this to [facility] on 03/06/14

## 2014-03-06 NOTE — Telephone Encounter (Signed)
Spoke to pt- referral is in process to [facility]. Pt asked to be patient. They will call them as soon as the appt is made. He is using recommended pain management from [name] until then.

## 2014-03-07 ENCOUNTER — Telehealth: Payer: Self-pay | Admitting: Family Medicine

## 2014-03-07 NOTE — Telephone Encounter (Signed)
Pt will be in tomorrow.

## 2014-03-07 NOTE — Telephone Encounter (Signed)
Patient states that he has an MRI scheduled for next Sunday however he is claustrophobic and will need a Valium before he goes to his appointment. Also states that he has some bumps under his arm. Should he RTC for that or can he have a cream to clear them up?   [phone]

## 2014-03-07 NOTE — Telephone Encounter (Signed)
I need to see the rash before I know what to subscribe

## 2014-03-09 ENCOUNTER — Ambulatory Visit (INDEPENDENT_AMBULATORY_CARE_PROVIDER_SITE_OTHER): Payer: Medicare Other | Admitting: Family Medicine

## 2014-03-09 VITALS — BP 104/68 | HR 71 | Temp 98.1°F | Resp 16 | Ht 66.0 in | Wt 201.2 lb

## 2014-03-09 DIAGNOSIS — M545 Low back pain: Secondary | ICD-10-CM

## 2014-03-09 DIAGNOSIS — R590 Localized enlarged lymph nodes: Secondary | ICD-10-CM

## 2014-03-09 MED ORDER — KETOROLAC TROMETHAMINE 60 MG/2ML IM SOLN
60.0000 mg | Freq: Once | INTRAMUSCULAR | Status: AC
  Administered 2014-03-09: 60 mg via INTRAMUSCULAR

## 2014-03-09 MED ORDER — IBUPROFEN 800 MG PO TABS: 800.0000 mg | ORAL_TABLET | Freq: Three times a day (TID) | ORAL | Status: DC | PRN

## 2014-03-09 NOTE — Patient Instructions (Signed)
The glands under the arms generally go up with inflammation and gradually resolve (get smaller) with time as long as is no irritation under the arm. I'm waiting for the MRI next weekend. I will get back toyou when I get the report

## 2014-03-09 NOTE — Progress Notes (Signed)
Subjective:    Patient ID: Jeremiah Goodwin, male    DOB: August 17, 1955, 58 y.o.   MRN: [id number]  HPI Chief Complaint  Patient presents with  . Cyst    CYST UNDER BOTH ARMS.     This chart was scribed for [name], MD by Thea Alken, ED Scribe. This patient was seen in room 4 and the patient's care was started at 1:39 PM.  HPI Comments: Jeremiah Goodwin is a 58 y.o. male who presents to the [facility] complaining of improving cyst to bilateral axillas. Pt states he had 2 lumps to the axilla that have gone down but was concerned. Pt denies being in pain.    Pt was seen here 5 days ago for lower back pain. He reports he has been taking 800 mg ibuprofen with relief. Pt is scheduled for an MRI.  Past Medical History  Diagnosis Date  . Hyperlipidemia   . Chronic pain syndrome     Knees to feet and low back, on Neurontin.  Marland Kitchen Hypertension   . Allergy   . Osteoporosis   . Arthritis   . Depression   . CAD (coronary artery disease)     a. 04/2013 Card CTA: mixed plaque in RCA/LAD, Ca 344;  b. 04/2013 Cath: LM nl, LAD min irregs, D3 50ost, LCX and OM's nl, RCA 53m (2.0x15 Vision BMS), EF 60%.   Allergies  Allergen Reactions  . Hydrocodone-Acetaminophen Itching    REACTION: itching   Prior to Admission medications   Medication Sig Start Date End Date Taking? Authorizing Provider  ALPRAZolam Duanne Moron) 1 MG tablet Take 1 tablet (1 mg total) by mouth 3 (three) times daily as needed for anxiety. 01/04/14  Yes [name], MD  amitriptyline (ELAVIL) 50 MG tablet Take 50 mg by mouth daily as needed for sleep.   Yes Historical Provider, MD  aspirin EC 81 MG tablet Take 81 mg by mouth at bedtime.    Yes Historical Provider, MD  clopidogrel (PLAVIX) 75 MG tablet Take 1 tablet (75 mg total) by mouth daily with breakfast. 08/09/13  Yes [name], MD  lisinopril (PRINIVIL,ZESTRIL) 10 MG tablet take 1 tablet by mouth once daily 02/13/14  Yes [name], MD  omega-3 acid  ethyl esters (LOVAZA) 1 G capsule take 2 capsules by mouth twice a day 12/02/13  Yes Chelle S Jeffery, PA-C  Oxycodone HCl 10 MG TABS Take 1 tablet by mouth every 6 (six) hours as needed (pain).  09/18/13  Yes Historical Provider, MD  risedronate (ACTONEL) 35 MG tablet Take 35 mg by mouth every 7 (seven) days. On Mondays, with water on empty stomach, nothing by mouth or lie down for next 30 minutes.   Yes Historical Provider, MD  sildenafil (VIAGRA) 100 MG tablet Take 0.5-1 tablets (50-100 mg total) by mouth daily as needed for erectile dysfunction. 11/20/13  Yes [name], MD  tadalafil (CIALIS) 5 MG tablet Take 1 tablet (5 mg total) by mouth daily as needed for erectile dysfunction. 01/04/14  Yes [name], MD   Review of Systems  Musculoskeletal: Positive for back pain.  Hematological: Positive for adenopathy.       Objective:   Physical Exam  Constitutional: He is oriented to person, place, and time. He appears well-developed and well-nourished. No distress.  HENT:  Head: Normocephalic and atraumatic.  Eyes: Conjunctivae and EOM are normal.  Neck: Neck supple.  Cardiovascular: Normal rate.   Pulmonary/Chest: Effort normal.  Musculoskeletal:  Normal range of motion.  Neurological: He is alert and oriented to person, place, and time.  Skin: Skin is warm and dry.  Small .5 cm easily removable, mobile, subcutaneous nodule in both axilla. Both axilla are hyperpigmented   Psychiatric: He has a normal mood and affect. His behavior is normal.  Nursing note and vitals reviewed.  Assessment & Plan:   1. Midline low back pain, with sciatica presence unspecified   2. Axillary adenopathy    Meds ordered this encounter  Medications  . ibuprofen (ADVIL,MOTRIN) 800 MG tablet    Sig: Take 1 tablet (800 mg total) by mouth every 8 (eight) hours as needed.    Dispense:  90 tablet    Refill:  0  . ketorolac (TORADOL) injection 60 mg    Sig:    [name], MD

## 2014-03-16 ENCOUNTER — Ambulatory Visit
Admission: RE | Admit: 2014-03-16 | Discharge: 2014-03-16 | Disposition: A | Discharge status: Home / Self Care | Payer: Medicare Other | Source: Ambulatory Visit | Attending: Family Medicine | Admitting: Family Medicine

## 2014-03-16 DIAGNOSIS — M545 Low back pain: Secondary | ICD-10-CM

## 2014-03-17 ENCOUNTER — Other Ambulatory Visit: Payer: Self-pay | Admitting: Family Medicine

## 2014-03-17 DIAGNOSIS — M5137 Other intervertebral disc degeneration, lumbosacral region: Secondary | ICD-10-CM

## 2014-03-23 ENCOUNTER — Other Ambulatory Visit: Payer: Self-pay | Admitting: Internal Medicine

## 2014-04-01 NOTE — Progress Notes (Signed)
Patient ID: Jeremiah Goodwin, male   DOB: 08/05/1955, 58 y.o.   MRN: [id number] 58 yo f/u CAD Hospitalized 1/15 with angina Abnormal CT with high calcium score Cath resulted in stenting of RCA  IMPRESSIONS:  1. Normal left main coronary artery. 2. Mild atherosclerosis in the left anterior descending artery and its branches. 3. Mild atherosclerosis in the left circumflex artery and its branches. 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 Moderately diseased vessel diffusely. Successful bare-metal stent placement to the mid right coronary artery with a 2.0 x 15 mini vision stent, postdilated to 2.6 mm in diameter. We used a bare-metal stent due to the patient's recent history of rectal bleeding and need for a GI workup. I did not want to commit him to long-term dual antiplatelet therapy. That is also why we did not cover the entire area of disease in the mid right coronary artery.  4. Normal left ventricular systolic function. LVEDP 20 mmHg. Ejection fraction 60 %.   Low risk myovue 10/23/13   Reviewed    Overall Impression: There is suggestion of some change since March, 2010. In the past wall motion was normal and there were no definite areas of ischemia. Currently there is an area that may represent a small area of scar with mild ischemia. The scar is in the mid inferolateral segment and lateral apical segment. The ischemia is in the apical inferior segment and apical cap. This is a mild abnormality. Overall this is a low risk scan.  LV Ejection Fraction: 45%. LV Wall Motion: There is mild reduction in the overall ejection fraction. There may be mild hypokinesis near the inferolateral apex.  Still smoking counseled by less than 10 minutes  Stopped taking his plavix and not at one year mark yet    ROS: Denies fever, malais, weight loss, blurry vision, decreased visual acuity, cough, sputum, SOB, hemoptysis, pleuritic pain, palpitaitons, heartburn, abdominal pain, melena, lower extremity  edema, claudication, or rash.  All other systems reviewed and negative  General: Affect appropriate Healthy:  appears stated age 38: normal Neck supple with no adenopathy JVP normal no bruits no thyromegaly Lungs clear with no wheezing and good diaphragmatic motion Heart:  S1/S2 no murmur, no rub, gallop or click PMI normal Abdomen: benighn, BS positve, no tenderness, no AAA no bruit.  No HSM or HJR Distal pulses intact with no bruits No edema Neuro non-focal Skin warm and dry No muscular weakness   Current Outpatient Prescriptions  Medication Sig Dispense Refill  . ALPRAZolam (XANAX) 1 MG tablet Take 1 tablet (1 mg total) by mouth 3 (three) times daily as needed for anxiety. 90 tablet 5  . amitriptyline (ELAVIL) 50 MG tablet Take 50 mg by mouth daily as needed for sleep.    Marland Kitchen aspirin EC 81 MG tablet Take 81 mg by mouth at bedtime.     . clopidogrel (PLAVIX) 75 MG tablet Take 1 tablet (75 mg total) by mouth daily with breakfast. 30 tablet 6  . ibuprofen (ADVIL,MOTRIN) 800 MG tablet Take 1 tablet (800 mg total) by mouth every 8 (eight) hours as needed. 90 tablet 0  . lisinopril (PRINIVIL,ZESTRIL) 10 MG tablet take 1 tablet by mouth once daily 30 tablet 4  . omega-3 acid ethyl esters (LOVAZA) 1 G capsule take 2 capsules by mouth twice a day 120 capsule 3  . Oxycodone HCl 10 MG TABS Take 1 tablet by mouth every 6 (six) hours as needed (pain).     . risedronate (  ACTONEL) 35 MG tablet Take 35 mg by mouth every 7 (seven) days. On Mondays, with water on empty stomach, nothing by mouth or lie down for next 30 minutes.    . sildenafil (VIAGRA) 100 MG tablet Take 0.5-1 tablets (50-100 mg total) by mouth daily as needed for erectile dysfunction. 5 tablet 11  . tadalafil (CIALIS) 5 MG tablet Take 1 tablet (5 mg total) by mouth daily as needed for erectile dysfunction. 30 tablet 3   No current facility-administered medications for this visit.     Allergies  Hydrocodone-acetaminophen  Electrocardiogram:  6/24  SR rate 72 [id number]  Assessment and Plan

## 2014-04-02 ENCOUNTER — Telehealth: Payer: Self-pay

## 2014-04-02 ENCOUNTER — Ambulatory Visit (INDEPENDENT_AMBULATORY_CARE_PROVIDER_SITE_OTHER): Payer: Medicare Other | Admitting: Family Medicine

## 2014-04-02 ENCOUNTER — Encounter: Payer: Self-pay | Admitting: Cardiovascular Disease

## 2014-04-02 ENCOUNTER — Ambulatory Visit (INDEPENDENT_AMBULATORY_CARE_PROVIDER_SITE_OTHER): Payer: Medicare Other | Admitting: Cardiovascular Disease

## 2014-04-02 VITALS — BP 131/86 | HR 83 | Temp 98.3°F | Resp 16 | Ht 66.75 in | Wt 205.2 lb

## 2014-04-02 VITALS — BP 112/76 | HR 96 | Ht 66.0 in | Wt 201.0 lb

## 2014-04-02 DIAGNOSIS — I2510 Atherosclerotic heart disease of native coronary artery without angina pectoris: Secondary | ICD-10-CM

## 2014-04-02 DIAGNOSIS — I2583 Coronary atherosclerosis due to lipid rich plaque: Secondary | ICD-10-CM

## 2014-04-02 DIAGNOSIS — E7800 Pure hypercholesterolemia, unspecified: Secondary | ICD-10-CM

## 2014-04-02 DIAGNOSIS — M48061 Spinal stenosis, lumbar region without neurogenic claudication: Secondary | ICD-10-CM

## 2014-04-02 DIAGNOSIS — M4806 Spinal stenosis, lumbar region: Secondary | ICD-10-CM

## 2014-04-02 DIAGNOSIS — I10 Essential (primary) hypertension: Secondary | ICD-10-CM

## 2014-04-02 DIAGNOSIS — M25562 Pain in left knee: Secondary | ICD-10-CM

## 2014-04-02 DIAGNOSIS — M545 Low back pain: Secondary | ICD-10-CM

## 2014-04-02 MED ORDER — DICLOFENAC SODIUM 75 MG PO TBEC: 75.0000 mg | DELAYED_RELEASE_TABLET | Freq: Two times a day (BID) | ORAL | Status: DC

## 2014-04-02 MED ORDER — OXYCODONE HCL 10 MG PO TABS: 10.0000 mg | ORAL_TABLET | Freq: Four times a day (QID) | ORAL | Status: DC | PRN

## 2014-04-02 MED ORDER — KETOROLAC TROMETHAMINE 60 MG/2ML IM SOLN
60.0000 mg | Freq: Once | INTRAMUSCULAR | Status: AC
  Administered 2014-04-02: 60 mg via INTRAMUSCULAR

## 2014-04-02 NOTE — Assessment & Plan Note (Signed)
Cholesterol is at goal.  Continue current dose of statin and diet Rx.  No myalgias or side effects.  F/U  LFT's in 6 months. Lab Results  Component Value Date   LDLCALC 63 10/23/2013

## 2014-04-02 NOTE — Telephone Encounter (Signed)
Patient LMOM asking about referral. It was denied for nuerosurgery by Dr. Vertell Limber. Please advise [name] what we can do for patient. Thank you.   Best: [phone]

## 2014-04-02 NOTE — Assessment & Plan Note (Signed)
Well controlled.  Continue current medications and low sodium Dash type diet.    

## 2014-04-02 NOTE — Telephone Encounter (Signed)
Patient wants an extended return to work note. He says his back is not getting any better. He also wants to know the results of his MRI

## 2014-04-02 NOTE — Progress Notes (Signed)
This chart was scribed for [name], MD by Edison Simon, ED Scribe. This patient was seen in room 12.     Patient ID: Jeremiah Goodwin MRN: [id number], DOB: 20-Jun-1955, 58 y.o. Date of Encounter: 04/02/2014, 8:53 PM  Primary Physician: [name], MD  Chief Complaint: follow up for back pain   HPI: 58 y.o. year old male with history below presents with follow up for continued back pain. He reports prior back surgery with Dr. Vertell Limber. He also complains of left knee pain and hip pain; he reports prior knee surgery from Dr. Reynaldo Minium. He states he had a MRI done 2 weeks ago; it revealed multiple areas of arthritis in his lumbar back and spinal stenosis. Records indicate Dr. Vertell Limber denied his referral. He states he sometimes has shoulder pain too. He states he gets injections in his joints with improvement, the last was in his hip 1 month ago. He notes frequent BMs this week with associated nausea but denies vomiting. He notes history of heart problems and has stents.  Past Medical History  Diagnosis Date  . Hyperlipidemia   . Chronic pain syndrome     Knees to feet and low back, on Neurontin.  Marland Kitchen Hypertension   . Allergy   . Osteoporosis   . Arthritis   . Depression   . CAD (coronary artery disease)     a. 04/2013 Card CTA: mixed plaque in RCA/LAD, Ca 344;  b. 04/2013 Cath: LM nl, LAD min irregs, D3 50ost, LCX and OM's nl, RCA 46m (2.0x15 Vision BMS), EF 60%.     Home Meds: Prior to Admission medications   Medication Sig Start Date End Date Taking? Authorizing Provider  ALPRAZolam Duanne Moron) 1 MG tablet Take 1 tablet (1 mg total) by mouth 3 (three) times daily as needed for anxiety. 01/04/14   [name], MD  amitriptyline (ELAVIL) 50 MG tablet Take 50 mg by mouth daily as needed for sleep.    Historical Provider, MD  aspirin EC 81 MG tablet Take 81 mg by mouth at bedtime.     Historical Provider, MD  ibuprofen (ADVIL,MOTRIN) 800 MG tablet Take 1 tablet (800 mg total) by mouth every 8  (eight) hours as needed. 03/09/14   [name], MD  lisinopril (PRINIVIL,ZESTRIL) 10 MG tablet take 1 tablet by mouth once daily 02/13/14   [name], MD  omega-3 acid ethyl esters (LOVAZA) 1 G capsule take 2 capsules by mouth twice a day 12/02/13   Chelle S Jeffery, PA-C  Oxycodone HCl 10 MG TABS Take 1 tablet by mouth every 6 (six) hours as needed (pain).  09/18/13   Historical Provider, MD  prasugrel (EFFIENT) 10 MG TABS tablet Take 10 mg by mouth daily. FOR  1 MONTH    Historical Provider, MD  risedronate (ACTONEL) 35 MG tablet Take 35 mg by mouth every 7 (seven) days. On Mondays, with water on empty stomach, nothing by mouth or lie down for next 30 minutes.    Historical Provider, MD    Allergies:  Allergies  Allergen Reactions  . Hydrocodone-Acetaminophen Itching    REACTION: itching  . Vicodin [Hydrocodone-Acetaminophen]     History   Social History  . Marital Status: Divorced    Spouse Name: N/A    Number of Children: N/A  . Years of Education: N/A   Occupational History  . Unemployed    Social History Main Topics  . Smoking status: Current Every Day Smoker -- 0.33 packs/day for 42 years  Types: Cigarettes  . Smokeless tobacco: Never Used     Comment: PATIENT IS TRYING TO QUIT HE SAID  . Alcohol Use: 0.0 oz/week    0 Not specified per week     Comment: rarely drinks   . Drug Use: No  . Sexual Activity: No   Other Topics Concern  . Not on file   Social History Narrative   Lives in [place].     Review of Systems: Constitutional: negative for chills, fever, night sweats, weight changes, or fatigue  HEENT: negative for vision changes, hearing loss, congestion, rhinorrhea, ST, epistaxis, or sinus pressure Cardiovascular: negative for chest pain or palpitations Respiratory: negative for hemoptysis, wheezing, shortness of breath, or cough Abdominal: negative for abdominal pain, nausea, vomiting, diarrhea, or constipation, positive frequent  BMs Dermatological: negative for rash Neurologic: negative for headache, dizziness, or syncope Musculoskeletal: Positive back pain, knee pain, hip pain, shoulder pain All other systems reviewed and are otherwise negative with the exception to those above and in the HPI.   Physical Exam: Blood pressure 131/86, pulse 83, temperature 98.3 F (36.8 C), temperature source Oral, resp. rate 16, height 5' 6.75" (1.695 m), weight 205 lb 3.2 oz (93.078 kg), SpO2 98 %., Body mass index is 32.4 kg/(m^2). General: Well developed, well nourished, in no acute distress. Head: Normocephalic, atraumatic, eyes without discharge, sclera non-icteric, nares are without discharge. Bilateral auditory canals clear, TM's are without perforation, pearly grey and translucent with reflective cone of light bilaterally. Oral cavity moist, posterior pharynx without exudate, erythema, peritonsillar abscess, or post nasal drip.  Neck: Supple. No thyromegaly. Full ROM. No lymphadenopathy. Lungs: Clear bilaterally to auscultation without wheezes, rales, or rhonchi. Breathing is unlabored. Heart: RRR with S1 S2. No murmurs, rubs, or gallops appreciated. Abdomen: Soft, non-tender, non-distended with normoactive bowel sounds. No hepatomegaly. No rebound/guarding. No obvious abdominal masses. Msk:  Strength and tone normal for age. Extremities/Skin: Warm and dry. No clubbing or cyanosis. No edema. No rashes or suspicious lesions. Neuro: Alert and oriented X 3. Moves all extremities spontaneously. Gait is normal. CNII-XII grossly in tact. Psych:  Responds to questions appropriately with a normal affect.  Good left ROM without swelling or tenderness. Normal SLR; no muscle wasting No edema  ASSESSMENT AND PLAN:  This chart was scribed in my presence and reviewed by me personally.    ICD-9-CM ICD-10-CM   1. Left knee pain 719.46 M25.562 Oxycodone HCl 10 MG TABS     diclofenac (VOLTAREN) 75 MG EC tablet     AMB referral to  orthopedics     ketorolac (TORADOL) injection 60 mg  2. Midline low back pain, with sciatica presence unspecified 724.2 M54.5 Oxycodone HCl 10 MG TABS     diclofenac (VOLTAREN) 75 MG EC tablet     AMB referral to orthopedics     ketorolac (TORADOL) injection 60 mg  3. Spinal stenosis of lumbar region 724.02 M48.06 AMB referral to orthopedics     ketorolac (TORADOL) injection 60 mg    Left knee pain - Plan: Oxycodone HCl 10 MG TABS, diclofenac (VOLTAREN) 75 MG EC tablet, AMB referral to orthopedics, ketorolac (TORADOL) injection 60 mg  Midline low back pain, with sciatica presence unspecified - Plan: Oxycodone HCl 10 MG TABS, diclofenac (VOLTAREN) 75 MG EC tablet, AMB referral to orthopedics, ketorolac (TORADOL) injection 60 mg  Spinal stenosis of lumbar region - Plan: AMB referral to orthopedics, ketorolac (TORADOL) injection 60 mg     Signed, [name], MD 04/02/2014 8:53 PM

## 2014-04-02 NOTE — Assessment & Plan Note (Addendum)
Stable with no angina and good activity level.  Continue medical Rx Myovue low risk 7/15  Discussed not using viagra and cialis with patient  Gave him samples of effient to take to get him to one full year with DAT

## 2014-04-02 NOTE — Patient Instructions (Signed)
Your physician wants you to follow-up in:   [place] will receive a reminder letter in the mail two months in advance. If you don't receive a letter, please call our office to schedule the follow-up appointment. Your physician has recommended you make the following change in your medication:  TAKE  EFFIENT   10 MG  1  EVERY DAY  FOR  1 MONTH THEN  MAY STOP

## 2014-04-03 ENCOUNTER — Encounter (HOSPITAL_COMMUNITY): Payer: Self-pay | Admitting: Interventional Cardiology

## 2014-04-04 ENCOUNTER — Telehealth: Payer: Self-pay

## 2014-04-04 NOTE — Telephone Encounter (Signed)
Called patient to remind him to get his flu shot, but he stated that he got it in September at [facility].

## 2014-04-21 ENCOUNTER — Other Ambulatory Visit: Payer: Self-pay | Admitting: Physician Assistant

## 2014-05-21 ENCOUNTER — Telehealth: Payer: Self-pay

## 2014-05-21 NOTE — Telephone Encounter (Signed)
Pt of Dr. Carlean Jews is needing an updated handy cap sticker. Would like to come by and pick it up when it is ready. Please advise

## 2014-05-22 NOTE — Telephone Encounter (Signed)
[name], form placed in your box for signature if you approve.

## 2014-05-25 ENCOUNTER — Other Ambulatory Visit: Payer: Self-pay | Admitting: Physician Assistant

## 2014-06-18 ENCOUNTER — Other Ambulatory Visit: Payer: Self-pay | Admitting: Family Medicine

## 2014-07-13 ENCOUNTER — Other Ambulatory Visit: Payer: Self-pay | Admitting: Family Medicine

## 2014-07-17 ENCOUNTER — Other Ambulatory Visit: Payer: Self-pay | Admitting: Family Medicine

## 2014-07-19 ENCOUNTER — Other Ambulatory Visit: Payer: Self-pay | Admitting: Family Medicine

## 2014-07-19 NOTE — Telephone Encounter (Signed)
Faxed

## 2014-07-28 ENCOUNTER — Other Ambulatory Visit: Payer: Self-pay | Admitting: Family Medicine

## 2014-07-29 ENCOUNTER — Encounter: Payer: Self-pay | Admitting: *Deleted

## 2014-08-07 ENCOUNTER — Ambulatory Visit (INDEPENDENT_AMBULATORY_CARE_PROVIDER_SITE_OTHER): Payer: Medicare Other | Admitting: Family Medicine

## 2014-08-07 ENCOUNTER — Encounter: Payer: Self-pay | Admitting: Family Medicine

## 2014-08-07 VITALS — BP 118/67 | HR 66 | Temp 98.0°F | Resp 16 | Ht 66.5 in | Wt 207.8 lb

## 2014-08-07 DIAGNOSIS — S39012A Strain of muscle, fascia and tendon of lower back, initial encounter: Secondary | ICD-10-CM

## 2014-08-07 MED ORDER — IBUPROFEN 800 MG PO TABS: 800.0000 mg | ORAL_TABLET | Freq: Three times a day (TID) | ORAL | Status: DC | PRN

## 2014-08-07 MED ORDER — METHYLPREDNISOLONE ACETATE 80 MG/ML IJ SUSP
80.0000 mg | Freq: Once | INTRAMUSCULAR | Status: AC
  Administered 2014-08-07: 80 mg via INTRAMUSCULAR

## 2014-08-07 NOTE — Progress Notes (Signed)
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 He's been having left sciatica over the last 3 days along with back pain that is bilateral and runs all to the horizontal line connecting the 2 posterior superior iliac crests.  Patient has had back surgery and neck surgery in the past. He takes ibuprofen chronically for his back and neck  Objective: No acute distress Patient has positive straight leg raising on the left. Of note there is a well-healed surgical scar over the left knee reflecting a total knee replacement.  There is no muscle wasting in either leg  Assessment: Early sciatica following traumatic twisting rotation of the lumbar spine  Plan: This chart was scribed in my presence and reviewed by me personally.    ICD-9-CM ICD-10-CM   1. Back strain, initial encounter 847.9 S39.012A methylPREDNISolone acetate (DEPO-MEDROL) injection 80 mg     ibuprofen (ADVIL,MOTRIN) 800 MG tablet     Signed, [name], MD

## 2014-08-11 ENCOUNTER — Other Ambulatory Visit: Payer: Self-pay | Admitting: Family Medicine

## 2014-08-12 ENCOUNTER — Telehealth: Payer: Self-pay

## 2014-08-12 NOTE — Telephone Encounter (Signed)
Dr Carlean Jews, I'm forwarding [place] to you also because we put note on last RF to RTC for more. Pt did RTC, but didn't discuss this with you. Do you want to give RFs or have pt come back for lab/OV?

## 2014-08-12 NOTE — Telephone Encounter (Signed)
Rx sent in. Alprazolam 1 mg tablets.

## 2014-08-30 ENCOUNTER — Other Ambulatory Visit: Payer: Self-pay | Admitting: Physician Assistant

## 2014-10-01 ENCOUNTER — Other Ambulatory Visit: Payer: Self-pay | Admitting: Physician Assistant

## 2014-10-03 ENCOUNTER — Other Ambulatory Visit: Payer: Self-pay | Admitting: Family Medicine

## 2014-10-15 ENCOUNTER — Other Ambulatory Visit: Payer: Self-pay | Admitting: Family Medicine

## 2014-10-18 ENCOUNTER — Telehealth: Payer: Self-pay

## 2014-10-18 NOTE — Telephone Encounter (Signed)
[place] called in concerning his refill on the Xanax. He states that the pharmacy has been faxing [place] request for refills but they haven't received anything yet. Please call with update.

## 2014-10-20 NOTE — Telephone Encounter (Signed)
Rx faxed    Pt notified

## 2014-10-21 ENCOUNTER — Encounter (HOSPITAL_BASED_OUTPATIENT_CLINIC_OR_DEPARTMENT_OTHER): Payer: Self-pay

## 2014-10-27 ENCOUNTER — Telehealth: Payer: Self-pay

## 2014-10-27 ENCOUNTER — Other Ambulatory Visit: Payer: Self-pay | Admitting: Family Medicine

## 2014-10-27 ENCOUNTER — Other Ambulatory Visit: Payer: Self-pay | Admitting: Physician Assistant

## 2014-10-27 NOTE — Telephone Encounter (Signed)
The pharmacy is the [facility] on [address].

## 2014-10-27 NOTE — Telephone Encounter (Signed)
Patient called about rx refills for omega-3 acid ethyl esters (LOVAZA) 1 G capsule and lisinopril (PRINIVIL,ZESTRIL) 10 MG tablet.  Please advise.  Thank you.  CB#: [phone]

## 2014-10-29 ENCOUNTER — Ambulatory Visit (HOSPITAL_BASED_OUTPATIENT_CLINIC_OR_DEPARTMENT_OTHER): Payer: Medicare Other | Attending: Anesthesiology | Admitting: Radiology

## 2014-10-29 VITALS — Ht 67.0 in | Wt 210.0 lb

## 2014-10-29 DIAGNOSIS — Z6833 Body mass index (BMI) 33.0-33.9, adult: Secondary | ICD-10-CM | POA: Diagnosis not present

## 2014-10-29 DIAGNOSIS — R0683 Snoring: Secondary | ICD-10-CM | POA: Diagnosis not present

## 2014-10-29 DIAGNOSIS — E669 Obesity, unspecified: Secondary | ICD-10-CM | POA: Insufficient documentation

## 2014-10-29 DIAGNOSIS — R5383 Other fatigue: Secondary | ICD-10-CM | POA: Diagnosis not present

## 2014-10-29 NOTE — Telephone Encounter (Signed)
Sent yesterday

## 2014-11-01 DIAGNOSIS — R0683 Snoring: Secondary | ICD-10-CM | POA: Diagnosis not present

## 2014-11-01 DIAGNOSIS — N529 Male erectile dysfunction, unspecified: Secondary | ICD-10-CM | POA: Diagnosis not present

## 2014-11-01 NOTE — Progress Notes (Signed)
Patient Name: Jeremiah Goodwin, [name] Date: 10/29/2014 Gender: Male D.O.B: 1956-02-17 Age (years): 50 Referring Provider: Not Available Height (inches): 67 Interpreting Physician: Baird Lyons MD, ABSM Weight (lbs): 210 RPSGT: Laren Everts BMI: 33 MRN: [id number] Neck Size: 16.00 CLINICAL INFORMATION Sleep Study Type: NPSG  Indication for sleep study: Fatigue, Obesity, Snoring  Epworth Sleepiness Score: 13  SLEEP STUDY TECHNIQUE As per the AASM Manual for the Scoring of Sleep and Associated Events v2.3 (April 2016) with a hypopnea requiring 4% desaturations.  The channels recorded and monitored were frontal, central and occipital EEG, electrooculogram (EOG), submentalis EMG (chin), nasal and oral airflow, thoracic and abdominal wall motion, anterior tibialis EMG, snore microphone, electrocardiogram, and pulse oximetry.  MEDICATIONS Patient's medications include: Charted for review. Medications self-administered by patient during sleep study : No sleep medicine administered.  SLEEP ARCHITECTURE The study was initiated at 10:58:56 PM and ended at 5:07:52 AM.  Sleep onset time was 0.9 minutes and the sleep efficiency was 96.9%. The total sleep time was 357.5 minutes.  Stage REM latency was 95.5 minutes.  The patient spent 5.87% of the night in stage N1 sleep, 76.22% in stage N2 sleep, 0.00% in stage N3 and 17.90% in REM.  Alpha intrusion was absent.  Supine sleep was 53.13%.  RESPIRATORY PARAMETERS The overall apnea/hypopnea index (AHI) was 4.2 per hour. There were 8 total apneas, including 8 obstructive, 0 central and 0 mixed apneas. There were 17 hypopneas and 16 RERAs.  The AHI during Stage REM sleep was 14.1 per hour.  AHI while supine was 7.3 per hour.  The mean oxygen saturation was 94.24%. The minimum SpO2 during sleep was 85.00%.  Moderate snoring was noted during this study.  CARDIAC DATA The 2 lead EKG demonstrated sinus rhythm. The mean heart rate was 59.40  beats per minute. Other EKG findings include: None. LEG MOVEMENT DATA The total PLMS were 53 with a resulting PLMS index of 8.90. Associated arousal with leg movement index was 0.2 .  IMPRESSIONS No significant obstructive sleep apnea occurred during this study (AHI = 4.2/h). No significant central sleep apnea occurred during this study (CAI = 0.0/h). Mild oxygen desaturation was noted during this study (Min O2 = 85.00%). The patient snored with Moderate snoring volume. No cardiac abnormalities were noted during this study. Mild periodic limb movements of sleep occurred during the study. No significant associated arousals. DIAGNOSIS Snoring RECOMMENDATIONS Avoid alcohol, sedatives and other CNS depressants that may worsen sleep apnea and disrupt normal sleep architecture. Sleep hygiene should be reviewed to assess factors that may improve sleep quality. Weight management and regular exercise should be initiated or continued if appropriate. MISCELLANEOUS       Deneise Lever Diplomate, American Board of Sleep Medicine  ELECTRONICALLY SIGNED ON:  11/01/2014, 10:42 AM [place] PH: [phone]   FX: [phone] [place]

## 2014-11-10 ENCOUNTER — Ambulatory Visit (INDEPENDENT_AMBULATORY_CARE_PROVIDER_SITE_OTHER): Payer: Medicare Other | Admitting: Family Medicine

## 2014-11-10 VITALS — BP 110/70 | HR 75 | Temp 98.7°F | Resp 18 | Ht 66.0 in | Wt 212.8 lb

## 2014-11-10 DIAGNOSIS — M4806 Spinal stenosis, lumbar region: Secondary | ICD-10-CM | POA: Diagnosis not present

## 2014-11-10 DIAGNOSIS — M25562 Pain in left knee: Secondary | ICD-10-CM

## 2014-11-10 DIAGNOSIS — M545 Low back pain: Secondary | ICD-10-CM

## 2014-11-10 DIAGNOSIS — M48061 Spinal stenosis, lumbar region without neurogenic claudication: Secondary | ICD-10-CM

## 2014-11-10 MED ORDER — OXYCODONE HCL 10 MG PO TABS: 10.0000 mg | ORAL_TABLET | Freq: Three times a day (TID) | ORAL | Status: DC | PRN

## 2014-11-10 MED ORDER — GABAPENTIN 300 MG PO CAPS: 300.0000 mg | ORAL_CAPSULE | Freq: Three times a day (TID) | ORAL | Status: AC

## 2014-11-10 MED ORDER — ALPRAZOLAM 1 MG PO TABS: 1.0000 mg | ORAL_TABLET | Freq: Three times a day (TID) | ORAL | Status: DC | PRN

## 2014-11-10 NOTE — Progress Notes (Signed)
This chart was scribed for Dr. [name], MD by Erling Conte, Medical Scribe. This patient was seen in Room 10  and the patient's care was started at 6:24 PM.   Patient ID: Jeremiah Goodwin MRN: [id number], DOB: 1956-04-15, 59 y.o. Date of Encounter: 11/10/2014, 6:22 PM  Primary Physician: [name], MD  Chief Complaint:  Chief Complaint  Patient presents with  . Back Pain    x 2 weeks  . Shoulder Pain  . Medication Refill    neurotin     HPI: 59 y.o. year old male with history below presents with intermittent, moderate neck pain. This is a chronic issue. Pt reports that the the pain is worse in the morning when he wakes up. He notes when he rotates his neck he has a popping sound in his neck. The pain radiates into his bilateral shoulder. Pt currently takes Neurontin for his chronic pain syndrome and would like a refill of this medication. He would also like a refill of his alprazolam. Pt is also asking for pain medication to help with the pain. He would like a referral for a neurosurgeon.  Pt is also complaining of moderate, intermittent, back pain. This is chronic issue but it has worsened over the back 2 weeks. Pt has previously had an MRI of his back but states he was never informed on the findings. He states the pain is beginning to radiates into his bilateral lower extremities. He reports he sometimes feels like his legs are giving out on him. He denies any numbness or sensation loss in lower extremities.    Past Medical History  Diagnosis Date  . Hyperlipidemia   . Chronic pain syndrome     Knees to feet and low back, on Neurontin.  Marland Kitchen Hypertension   . Allergy   . Osteoporosis   . Arthritis   . Depression   . CAD (coronary artery disease)     a. 04/2013 Card CTA: mixed plaque in RCA/LAD, Ca 344;  b. 04/2013 Cath: LM nl, LAD min irregs, D3 50ost, LCX and OM's nl, RCA 22m (2.0x15 Vision BMS), EF 60%.     Home Meds: Prior to Admission medications   Medication  Sig Start Date End Date Taking? Authorizing Provider  ALPRAZolam Duanne Moron) 1 MG tablet TAKE 1 TABLET BY MOUTH THREE TIMES DAILY AS NEEDED FOR ANXIETY 10/18/14  Yes [name], MD  amitriptyline (ELAVIL) 50 MG tablet Take 50 mg by mouth daily as needed for sleep.   Yes Historical Provider, MD  aspirin EC 81 MG tablet Take 81 mg by mouth at bedtime.    Yes Historical Provider, MD  diclofenac (VOLTAREN) 75 MG EC tablet Take 1 tablet (75 mg total) by mouth 2 (two) times daily. 04/02/14  Yes [name], MD  ibuprofen (ADVIL,MOTRIN) 800 MG tablet Take 1 tablet (800 mg total) by mouth every 8 (eight) hours as needed. 08/07/14  Yes [name], MD  lisinopril (PRINIVIL,ZESTRIL) 10 MG tablet Take 1 tablet (10 mg total) by mouth daily. PATIENT NEEDS OFFICE VISIT FOR ADDITIONAL REFILLS 10/28/14  Yes [name], MD  omega-3 acid ethyl esters (LOVAZA) 1 G capsule Take 2 capsules (2 g total) by mouth 2 (two) times daily. PATIENT NEEDS OFFICE VISIT FOR ADDITIONAL REFILLS 10/28/14  Yes [name], MD  Oxycodone HCl 10 MG TABS Take 1 tablet (10 mg total) by mouth every 6 (six) hours as needed (pain). 04/02/14  Yes [name], MD  prasugrel (EFFIENT) 10 MG TABS tablet Take  10 mg by mouth daily. FOR  1 MONTH   Yes Historical Provider, MD  risedronate (ACTONEL) 35 MG tablet Take 35 mg by mouth every 7 (seven) days. On Mondays, with water on empty stomach, nothing by mouth or lie down for next 30 minutes.   Yes Historical Provider, MD    Allergies:  Allergies  Allergen Reactions  . Hydrocodone-Acetaminophen Itching    REACTION: itching  . Vicodin [Hydrocodone-Acetaminophen]     History   Social History  . Marital Status: Divorced    Spouse Name: N/A  . Number of Children: N/A  . Years of Education: N/A   Occupational History  . Unemployed    Social History Main Topics  . Smoking status: Current Every Day Smoker -- 0.33 packs/day for 42 years    Types: Cigarettes  . Smokeless tobacco:  Never Used     Comment: PATIENT IS TRYING TO QUIT HE SAID  . Alcohol Use: 0.0 oz/week    0 Standard drinks or equivalent per week     Comment: rarely drinks   . Drug Use: No  . Sexual Activity: No   Other Topics Concern  . Not on file   Social History Narrative   Lives in [place].     Review of Systems: Constitutional: negative for chills, fever, night sweats, weight changes, or fatigue  HEENT: negative for vision changes, hearing loss, congestion, rhinorrhea, ST, epistaxis, or sinus pressure Cardiovascular: negative for chest pain or palpitations Respiratory: negative for hemoptysis, wheezing, shortness of breath, or cough Abdominal: negative for abdominal pain, nausea, vomiting, diarrhea, or constipation Dermatological: negative for rash Neurologic: negative for headache, dizziness, syncope, numbness or sensation loss [place]: positive for neck pain, back pain and shoulder pain All other systems reviewed and are otherwise negative with the exception to those above and in the HPI.   Physical Exam: Blood pressure 110/70, pulse 75, temperature 98.7 F (37.1 C), resp. rate 18, height 5\' 6"  (1.676 m), weight 212 lb 12.8 oz (96.525 kg), SpO2 98 %., Body mass index is 34.36 kg/(m^2). General: Well developed, well nourished, in no acute distress. Head: Normocephalic, atraumatic, eyes without discharge, sclera non-icteric, nares are without discharge. Bilateral auditory canals clear, TM's are without perforation, pearly grey and translucent with reflective cone of light bilaterally. Oral cavity moist, posterior pharynx without exudate, erythema, peritonsillar abscess, or post nasal drip.  Neck: Supple. No thyromegaly. Full ROM. No lymphadenopathy. Lungs: Clear bilaterally to auscultation without wheezes, rales, or rhonchi. Breathing is unlabored. Heart: RRR with S1 S2. No murmurs, rubs, or gallops appreciated. Abdomen: Soft, non-tender, non-distended with normoactive bowel sounds. No  hepatomegaly. No rebound/guarding. No obvious abdominal masses. Msk:  Strength and tone normal for age. No knee jerk on left ankle. Positive SLR on left. Crepitus in his neck with rotation which 80 degrees rotation each way. Normal reflexes in right knee, right ankle and left ankle. No edema. Good pedal pulses. Well healed surgical scar over left knee and left hip. Extremities/Skin: Warm and dry. No clubbing or cyanosis. No edema. No rashes or suspicious lesions. Neuro: Alert and oriented X 3. Moves all extremities spontaneously. Gait is normal. CNII-XII grossly in tact. Psych:  Responds to questions appropriately with a normal affect.   Labs:   ASSESSMENT AND PLAN:  59 y.o. year old male with chronic pain syndromes  1. Left knee pain   2. Midline low back pain, with sciatica presence unspecified    This chart was scribed in my presence and reviewed by  me personally.    ICD-9-CM ICD-10-CM   1. Left knee pain 719.46 M25.562 Oxycodone HCl 10 MG TABS  2. Midline low back pain, with sciatica presence unspecified 724.2 M54.5 Oxycodone HCl 10 MG TABS     Ambulatory referral to Neurosurgery  3. Spinal stenosis of lumbar region 724.02 M48.06 gabapentin (NEURONTIN) 300 MG capsule     Ambulatory referral to Neurosurgery     Signed, [name], MD  Signed, [name], MD 11/10/2014 6:22 PM

## 2014-11-20 ENCOUNTER — Other Ambulatory Visit: Payer: Self-pay | Admitting: Family Medicine

## 2014-11-20 DIAGNOSIS — M5440 Lumbago with sciatica, unspecified side: Secondary | ICD-10-CM

## 2014-11-20 DIAGNOSIS — M542 Cervicalgia: Secondary | ICD-10-CM

## 2014-11-27 ENCOUNTER — Other Ambulatory Visit: Payer: Self-pay | Admitting: Family Medicine

## 2014-11-28 NOTE — Telephone Encounter (Signed)
Dr L, we had put notice on pt's last RF of these to RTC for more. Pt did return after that and discussed two meds, but not these. OK to give RFs?

## 2014-12-08 ENCOUNTER — Telehealth: Payer: Self-pay

## 2014-12-08 DIAGNOSIS — M545 Low back pain: Secondary | ICD-10-CM

## 2014-12-08 DIAGNOSIS — M25562 Pain in left knee: Secondary | ICD-10-CM

## 2014-12-08 MED ORDER — OXYCODONE HCL 10 MG PO TABS: 10.0000 mg | ORAL_TABLET | Freq: Three times a day (TID) | ORAL | Status: DC | PRN

## 2014-12-08 NOTE — Telephone Encounter (Signed)
I got it but patient should f/u with [name]. Please let patient know.

## 2014-12-08 NOTE — Telephone Encounter (Signed)
Can someone help Dr. Carlean Jews

## 2014-12-08 NOTE — Telephone Encounter (Signed)
Pt is needing a refill on percocet

## 2014-12-08 NOTE — Telephone Encounter (Signed)
Spoke with pt, advised Rx ready to pick up.

## 2014-12-15 ENCOUNTER — Other Ambulatory Visit: Payer: Self-pay | Admitting: Family Medicine

## 2014-12-15 ENCOUNTER — Telehealth: Payer: Self-pay

## 2014-12-15 NOTE — Telephone Encounter (Signed)
Patient called in asking for his ALPRAZolam Duanne Moron) 1 MG tablet to be refilled, he stated he is a Dr. Carlean Jews patient and that he is completely out. Please give him a call back at [phone]

## 2014-12-16 NOTE — Telephone Encounter (Signed)
ALPRAZolam (XANAX) 1 MG tablet   Please call patient and tell him why his medication was denied.   [phone]

## 2014-12-16 NOTE — Telephone Encounter (Signed)
Faxed Rx and notified pt that RF was not denied and that I am faxing it now. Reminded pt that per PA when percocet was filled, pt is due for f/up with Dr L. Pt agreed.

## 2015-01-05 ENCOUNTER — Ambulatory Visit (INDEPENDENT_AMBULATORY_CARE_PROVIDER_SITE_OTHER): Payer: Medicare Other | Admitting: Family Medicine

## 2015-01-05 ENCOUNTER — Telehealth: Payer: Self-pay

## 2015-01-05 VITALS — BP 120/74 | HR 70 | Temp 98.4°F | Resp 20 | Ht 66.0 in | Wt 216.5 lb

## 2015-01-05 DIAGNOSIS — G8918 Other acute postprocedural pain: Secondary | ICD-10-CM | POA: Diagnosis not present

## 2015-01-05 MED ORDER — DOXYCYCLINE HYCLATE 100 MG PO TABS: 100.0000 mg | ORAL_TABLET | Freq: Two times a day (BID) | ORAL | Status: AC

## 2015-01-05 NOTE — Telephone Encounter (Signed)
Patient had back surgery on 01/02/15 and the pain medication that he has been prescribed is not effective. Wants to know if he can have a stronger prescription to help with the pain.

## 2015-01-05 NOTE — Progress Notes (Addendum)
This chart was scribed for Jeremiah Haber, MD by Jeremiah Goodwin, medical scribe at [facility].The patient was seen in exam room 2 and the patient's care was started at 7:07 PM.  Patient ID: Jeremiah Goodwin MRN: [id number], DOB: 1955-12-10, 59 y.o. Date of Encounter: 01/05/2015  Primary Physician: Jeremiah Haber, MD  Chief Complaint:  Chief Complaint  Patient presents with   Back Pain    had back surgery on Friday-Was told to contact PCP for pain    HPI:  Jeremiah Goodwin is a 59 y.o. male who presents to [facility] complaining of back pain.  He had back surgery (by Dr. Kasandra Knudsen) done 3 days ago. He was told to contact PCP for pain. He has follow up with Dr. Kasandra Knudsen on Aug 22nd.   He's had some drainage in the area. He was only given oxycodone for the pain. Currently, he hasn't felt relief for his back since surgery. He also hasn't been able to pass a BM, and he has been taken miralax. He can't sleep supine due to pain and has been sleeping sitting up.   Today is his 1 month anniversary with his wife.   Past Medical History  Diagnosis Date   Hyperlipidemia    Chronic pain syndrome     Knees to feet and low back, on Neurontin.   Hypertension    Allergy    Osteoporosis    Arthritis    Depression    CAD (coronary artery disease)     a. 04/2013 Card CTA: mixed plaque in RCA/LAD, Ca 344;  b. 04/2013 Cath: LM nl, LAD min irregs, D3 50ost, LCX and OM's nl, RCA 68m (2.0x15 Vision BMS), EF 60%.     Home Meds: Prior to Admission medications   Medication Sig Start Date End Date Taking? Authorizing Provider  ALPRAZolam Duanne Moron) 1 MG tablet TAKE 1 TABLET BY MOUTH THREE TIMES DAILY AS NEEDED FOR ANXIETY 12/16/14   Jeremiah Haber, MD  amitriptyline (ELAVIL) 50 MG tablet Take 50 mg by mouth daily as needed for sleep.    Historical Provider, MD  aspirin EC 81 MG tablet Take 81 mg by mouth at bedtime.     Historical Provider, MD  diclofenac (VOLTAREN) 75 MG EC  tablet Take 1 tablet (75 mg total) by mouth 2 (two) times daily. 04/02/14   Jeremiah Haber, MD  gabapentin (NEURONTIN) 300 MG capsule Take 1 capsule (300 mg total) by mouth 3 (three) times daily. 11/10/14   Jeremiah Haber, MD  ibuprofen (ADVIL,MOTRIN) 800 MG tablet Take 1 tablet (800 mg total) by mouth every 8 (eight) hours as needed. 08/07/14   Jeremiah Haber, MD  lisinopril (PRINIVIL,ZESTRIL) 10 MG tablet TAKE 1 TABLET(10 MG) BY MOUTH DAILY 11/28/14   Jeremiah Haber, MD  omega-3 acid ethyl esters (LOVAZA) 1 G capsule TAKE 2 CAPSULES(2 GRAMS) BY MOUTH TWICE DAILY 11/28/14   Jeremiah Haber, MD  Oxycodone HCl 10 MG TABS Take 1 tablet (10 mg total) by mouth every 8 (eight) hours as needed (pain). 12/08/14   Jaynee Eagles, PA-C  prasugrel (EFFIENT) 10 MG TABS tablet Take 10 mg by mouth daily. FOR  1 MONTH    Historical Provider, MD  risedronate (ACTONEL) 35 MG tablet Take 35 mg by mouth every 7 (seven) days. On Mondays, with water on empty stomach, nothing by mouth or lie down for next 30 minutes.    Historical Provider, MD    Allergies:  Allergies  Allergen Reactions  Acetaminophen Itching   Hydrocodone-Acetaminophen Itching    REACTION: itching   Vicodin [Hydrocodone-Acetaminophen]     Social History   Social History   Marital Status: Divorced    Spouse Name: N/A   Number of Children: N/A   Years of Education: N/A   Occupational History   Unemployed    Social History Main Topics   Smoking status: Current Every Day Smoker -- 0.33 packs/day for 42 years    Types: Cigarettes   Smokeless tobacco: Never Used     Comment: PATIENT IS TRYING TO QUIT HE SAID   Alcohol Use: 0.0 oz/week    0 Standard drinks or equivalent per week     Comment: rarely drinks    Drug Use: No   Sexual Activity: No   Other Topics Concern   Not on file   Social History Narrative   Lives in [place].     Review of Systems: Constitutional: negative for chills, fever, night sweats, weight  changes, or fatigue  HEENT: negative for vision changes, hearing loss, congestion, rhinorrhea, ST, epistaxis, or sinus pressure Cardiovascular: negative for chest pain or palpitations Respiratory: negative for hemoptysis, wheezing, shortness of breath, or cough Abdominal: negative for abdominal pain, nausea, vomiting, diarrhea, or constipation Dermatological: negative for rash Neurologic: negative for headache, dizziness, or syncope Musc: positive for back pain All other systems reviewed and are otherwise negative with the exception to those above and in the HPI.  Physical Exam: Goodwin pressure 120/74, pulse 70, temperature 98.4 F (36.9 C), temperature source Oral, resp. rate 20, height 5\' 6"  (1.676 m), weight 216 lb 8 oz (98.204 kg), SpO2 98 %., Body mass index is 34.96 kg/(m^2). General: Well developed, well nourished, in obvious pain, moving slowly from chair to exam table Head: Normocephalic, atraumatic, eyes without discharge, sclera non-icteric, nares are without discharge.  Neck: Supple. No thyromegaly. Full ROM. No lymphadenopathy. Msk:  Strength and tone normal for age. Extremities/Skin: Warm and dry. Patient has Dermabond in place on the surgical scar in his lumbar spine. He does have tenderness only edges of the wound and there is some serosanguineous drainage from the wound site. Neuro: Alert and oriented X 3. Moves all extremities spontaneously. Gait is normal. CNII-XII grossly in tact. Diminished reflex in the left knee Psych:  Responds to questions appropriately with a normal affect.    ASSESSMENT AND PLAN:  59 y.o. year old male with  This chart was scribed in my presence and reviewed by me personally.    ICD-9-CM ICD-10-CM   1. Post-operative pain 338.18 G89.18 doxycycline (VIBRA-TABS) 100 MG tablet   I am not sure I understand why the surgeon refuses to see patient for another 10 days, but have asked him to call and try to get in to be evaluated because of the ongoing  pain and tenderness in the incision site  I've asked the patient to increase his MiraLAX to several times a day until he starts relieving the constipation.  Signed, Jeremiah Haber, MD 01/05/2015 7:07 PM

## 2015-01-05 NOTE — Patient Instructions (Signed)
I do not have anything stronger than the medicine or taking. For this kind of postoperative pain, and need to see your surgeon. You have significant amount of tenderness at the incision line so I am prescribing some antibiotic, but this really should be checked by your surgeon. He is responsible for your caregiver after surgery.

## 2015-01-05 NOTE — Telephone Encounter (Signed)
Patient needs to request this pain medication from his back surgeon. It is inappropriate for [place] to do this since we are not the office that did his surgery. Thank you.

## 2015-01-06 NOTE — Telephone Encounter (Signed)
Pt states he spoke with dr L and understands needs to go to back surgeon.

## 2015-01-15 ENCOUNTER — Other Ambulatory Visit: Payer: Self-pay | Admitting: Family Medicine

## 2015-01-16 NOTE — Telephone Encounter (Signed)
Faxed

## 2015-01-27 ENCOUNTER — Ambulatory Visit (INDEPENDENT_AMBULATORY_CARE_PROVIDER_SITE_OTHER): Payer: Medicare Other

## 2015-01-27 DIAGNOSIS — Z23 Encounter for immunization: Secondary | ICD-10-CM | POA: Diagnosis not present

## 2015-02-12 ENCOUNTER — Ambulatory Visit (INDEPENDENT_AMBULATORY_CARE_PROVIDER_SITE_OTHER): Payer: Medicare Other | Admitting: Family Medicine

## 2015-02-12 VITALS — BP 118/80 | HR 72 | Temp 99.2°F | Resp 18 | Ht 66.0 in | Wt 218.4 lb

## 2015-02-12 DIAGNOSIS — M25562 Pain in left knee: Secondary | ICD-10-CM

## 2015-02-12 DIAGNOSIS — M25552 Pain in left hip: Secondary | ICD-10-CM | POA: Diagnosis not present

## 2015-02-12 DIAGNOSIS — G8929 Other chronic pain: Secondary | ICD-10-CM | POA: Diagnosis not present

## 2015-02-12 DIAGNOSIS — F4323 Adjustment disorder with mixed anxiety and depressed mood: Secondary | ICD-10-CM | POA: Diagnosis not present

## 2015-02-12 DIAGNOSIS — M545 Low back pain: Secondary | ICD-10-CM | POA: Diagnosis not present

## 2015-02-12 MED ORDER — OXYCODONE HCL 10 MG PO TABS: 10.0000 mg | ORAL_TABLET | Freq: Three times a day (TID) | ORAL | Status: DC | PRN

## 2015-02-12 MED ORDER — KETOROLAC TROMETHAMINE 60 MG/2ML IM SOLN
60.0000 mg | Freq: Once | INTRAMUSCULAR | Status: AC
  Administered 2015-02-12: 60 mg via INTRAMUSCULAR

## 2015-02-12 MED ORDER — ALPRAZOLAM 1 MG PO TABS
1.0000 mg | ORAL_TABLET | Freq: Three times a day (TID) | ORAL | Status: AC | PRN
Start: 2015-02-12 — End: ?

## 2015-02-12 NOTE — Patient Instructions (Signed)
[facility]. I'm referring you to a pain clinic and want you to explain to your surgeon about how much pain or having.

## 2015-02-12 NOTE — Progress Notes (Signed)
Subjective:  This chart was scribed for Jeremiah Haber MD, by Jeremiah Goodwin, at [facility].  This patient was seen in room 14 and the patient's care was started at 9:58 AM.    Patient ID: Jeremiah Goodwin, male    DOB: [birth year], 59 y.o.   MRN: [id number] Chief Complaint  Patient presents with   Medication Refill    oxycodone 10mg     HPI HPI Comments: Jeremiah Goodwin is a 59 y.o. male who presents to the [facility] complaining of hip and back pain.  He states that when he is standing for a long period of time, he has the sensation that his leg will give out on him.  Patient states that his hip replacement helped him mildly but he feels worse at times as he feels numb from his left hip down.  He would like medication refill (Oxycodone)- which he takes 3 per day.  His follow up for his surgery is in 2 weeks. Patient is also currently taking Neurontin and elavil.  He has also been heating and icing his hip but doesn't feel any relief.  He was told not to go to the [facility] to swim but states that he would like to begin swimming again as soon as possible.   He would also like a refill for his Xanax and a shot for his pain today.    Patient Active Problem List   Diagnosis Date Noted   Polyp of colon 08/19/2013   CAD (coronary artery disease)    OA (osteoarthritis) of hip 05/04/2012   HTN (hypertension) 03/16/2012   Chronic pain syndrome 05/30/2011   Cervical disc disease 05/30/2011   Lumbar disc disease 05/30/2011   OBESITY 09/01/2008   HYPERCHOLESTEROLEMIA 08/20/2008   GASTROESOPHAGEAL REFLUX DISEASE 07/11/2008   DYSPHAGIA UNSPECIFIED 07/11/2008   TOBACCO ABUSE 06/18/2008   BACK PAIN, CHRONIC 06/18/2008   SLEEP DISORDER 06/18/2008   TOTAL KNEE REPLACEMENT, LEFT, HX OF 04/25/2005   ERECTILE DYSFUNCTION 05/10/2003   ANXIETY 02/07/2003   OSTEOPOROSIS 01/10/2003   Other postprocedural status(V45.89) 08/24/2002   Past Medical  History  Diagnosis Date   Hyperlipidemia    Chronic pain syndrome     Knees to feet and low back, on Neurontin.   Hypertension    Allergy    Osteoporosis    Arthritis    Depression    CAD (coronary artery disease)     a. 04/2013 Card CTA: mixed plaque in RCA/LAD, Ca 344;  b. 04/2013 Cath: LM nl, LAD min irregs, D3 50ost, LCX and OM's nl, RCA 22m (2.0x15 Vision BMS), EF 60%.   Past Surgical History  Procedure Laterality Date   Spine surgery      Has had surgery spine and low back   Joint replacement      Left total knee arthroplasty   Back surgery      8 different times   Hemorrhoid surgery     Total hip arthroplasty  05/04/2012    Procedure: TOTAL HIP ARTHROPLASTY;  Surgeon: Gearlean Alf, MD;  Location: [facility];  Service: Orthopedics;  Laterality: Left;   Cardiac catheterization     Coronary angioplasty with stent placement     Left heart catheterization with coronary angiogram N/A 05/14/2013    Procedure: LEFT HEART CATHETERIZATION WITH CORONARY ANGIOGRAM;  Surgeon: Jettie Booze, MD;  Location: [facility] CATH LAB;  Service: Cardiovascular;  Laterality: N/A;   Allergies  Allergen Reactions   Acetaminophen  Itching   Hydrocodone-Acetaminophen Itching    REACTION: itching   Vicodin [Hydrocodone-Acetaminophen]    Prior to Admission medications   Medication Sig Start Date End Date Taking? Authorizing Provider  ALPRAZolam Duanne Moron) 1 MG tablet TAKE 1 TABLET BY MOUTH THREE TIMES DAILY AS NEEDED FOR ANXIETY 01/16/15  Yes Jeremiah Haber, MD  amitriptyline (ELAVIL) 50 MG tablet Take 50 mg by mouth daily as needed for sleep.   Yes Historical Provider, MD  aspirin EC 81 MG tablet Take 81 mg by mouth at bedtime.    Yes Historical Provider, MD  baclofen (LIORESAL) 10 MG tablet Take 10 mg by mouth.   Yes Historical Provider, MD  doxycycline (VIBRA-TABS) 100 MG tablet Take 1 tablet (100 mg total) by mouth 2 (two) times daily. 01/05/15  Yes Jeremiah Haber, MD  gabapentin  (NEURONTIN) 300 MG capsule Take 1 capsule (300 mg total) by mouth 3 (three) times daily. 11/10/14  Yes Jeremiah Haber, MD  lisinopril (PRINIVIL,ZESTRIL) 10 MG tablet TAKE 1 TABLET(10 MG) BY MOUTH DAILY 11/28/14  Yes Jeremiah Haber, MD  omega-3 acid ethyl esters (LOVAZA) 1 G capsule TAKE 2 CAPSULES(2 GRAMS) BY MOUTH TWICE DAILY 11/28/14  Yes Jeremiah Haber, MD  Oxycodone HCl 10 MG TABS Take 1 tablet (10 mg total) by mouth every 8 (eight) hours as needed (pain). 12/08/14  Yes Jaynee Eagles, PA-C  senna-docusate (SENOKOT-S) 8.6-50 MG per tablet Take 2 tablets by mouth. 01/03/15  Yes Historical Provider, MD   Social History   Social History   Marital Status: Divorced    Spouse Name: N/A   Number of Children: N/A   Years of Education: N/A   Occupational History   Unemployed    Social History Main Topics   Smoking status: Current Every Day Smoker -- 0.33 packs/day for 42 years    Types: Cigarettes   Smokeless tobacco: Never Used     Comment: PATIENT IS TRYING TO QUIT HE SAID   Alcohol Use: 0.0 oz/week    0 Standard drinks or equivalent per week     Comment: rarely drinks    Drug Use: No   Sexual Activity: No   Other Topics Concern   Not on file   Social History Narrative   Lives in [place].       Review of Systems  Constitutional: Negative for fever and chills.  Eyes: Negative for pain, redness and itching.  Respiratory: Negative for cough and choking.   Gastrointestinal: Negative for nausea and vomiting.  Musculoskeletal: Positive for back pain. Negative for neck pain and neck stiffness.  Neurological: Negative for speech difficulty.       Objective:   Physical Exam  Constitutional: He appears well-developed and well-nourished. No distress.  HENT:  Head: Normocephalic and atraumatic.  Eyes: Pupils are equal, round, and reactive to light.  Neck: Normal range of motion.  Pulmonary/Chest: No respiratory distress.  Skin:  6 inch scar on his left hip.    Filed  Vitals:   02/12/15 0840  BP: 118/80  Pulse: 72  Temp: 99.2 F (37.3 C)  TempSrc: Oral  Resp: 18  Height: 5\' 6"  (1.676 m)  Weight: 218 lb 6.4 oz (99.066 kg)  SpO2: 98%          Assessment & Plan:   This chart was scribed in my presence and reviewed by me personally.    ICD-9-CM ICD-10-CM   1. Chronic hip pain, left 719.45 M25.552 ketorolac (TORADOL) injection 60 mg   338.29 G89.29 Ambulatory referral to  Pain Clinic  2. Left knee pain 719.46 M25.562 Oxycodone HCl 10 MG TABS     Ambulatory referral to Pain Clinic  3. Midline low back pain, with sciatica presence unspecified 724.2 M54.5 Oxycodone HCl 10 MG TABS     Ambulatory referral to Pain Clinic  4. Adjustment disorder with mixed anxiety and depressed mood 309.28 F43.23 ALPRAZolam Duanne Moron) 1 MG tablet     Signed, Jeremiah Haber, MD

## 2015-03-16 ENCOUNTER — Ambulatory Visit (INDEPENDENT_AMBULATORY_CARE_PROVIDER_SITE_OTHER): Payer: Medicare Other | Admitting: Family Medicine

## 2015-03-16 ENCOUNTER — Encounter: Payer: Self-pay | Admitting: Family Medicine

## 2015-03-16 VITALS — BP 136/82 | HR 95 | Temp 97.8°F | Resp 18 | Ht 67.0 in | Wt 221.4 lb

## 2015-03-16 DIAGNOSIS — H9319 Tinnitus, unspecified ear: Secondary | ICD-10-CM | POA: Diagnosis not present

## 2015-03-16 DIAGNOSIS — M25562 Pain in left knee: Secondary | ICD-10-CM

## 2015-03-16 DIAGNOSIS — M545 Low back pain: Secondary | ICD-10-CM | POA: Diagnosis not present

## 2015-03-16 DIAGNOSIS — M25552 Pain in left hip: Secondary | ICD-10-CM

## 2015-03-16 MED ORDER — OXYCODONE HCL 10 MG PO TABS: 10.0000 mg | ORAL_TABLET | Freq: Three times a day (TID) | ORAL | Status: AC | PRN

## 2015-03-16 NOTE — Progress Notes (Signed)
This chart was scribed for Jeremiah Haber, MD by Moises Blood, medical scribe at [facility].The patient was seen in exam room 3 and the patient's care was started at 2:15 PM.  Patient ID: Jeremiah Goodwin MRN: [id number], DOB: 1955/05/08, 59 y.o. Date of Encounter: 03/16/2015  Primary Physician: Jeremiah Haber, MD  Chief Complaint:  Chief Complaint  Patient presents with  . OTHER    Meds refill, Oxycodone    HPI:  Jeremiah Goodwin is a 59 y.o. male who presents to [facility] for medication refill on oxycodone for his chronic left hip pain.  Left Hip Pain He's feeling about the same. He's been walking in the water for light exercise. He can straighten his legs. He mentions having pulsating feeling in his left knee.   Back Pain His back is still sore. He can't lay flat, and still sleeps on a heat pad.   Ear Ringing He also notes having some tinnitus bilaterally, describing it as a small hum.   Personal He plans on flying up to [place] to visit his father, who is turning 80 y.o.   Past Medical History  Diagnosis Date  . Hyperlipidemia   . Chronic pain syndrome     Knees to feet and low back, on Neurontin.  Marland Kitchen Hypertension   . Allergy   . Osteoporosis   . Arthritis   . Depression   . CAD (coronary artery disease)     a. 04/2013 Card CTA: mixed plaque in RCA/LAD, Ca 344;  b. 04/2013 Cath: LM nl, LAD min irregs, D3 50ost, LCX and OM's nl, RCA 76m (2.0x15 Vision BMS), EF 60%.     Home Meds: Prior to Admission medications   Medication Sig Start Date End Date Taking? Authorizing Provider  ALPRAZolam Duanne Moron) 1 MG tablet Take 1 tablet (1 mg total) by mouth 3 (three) times daily as needed. for anxiety 02/12/15   Jeremiah Haber, MD  amitriptyline (ELAVIL) 50 MG tablet Take 50 mg by mouth daily as needed for sleep.    Historical Provider, MD  aspirin EC 81 MG tablet Take 81 mg by mouth at bedtime.     Historical Provider, MD  baclofen (LIORESAL) 10  MG tablet Take 10 mg by mouth.    Historical Provider, MD  doxycycline (VIBRA-TABS) 100 MG tablet Take 1 tablet (100 mg total) by mouth 2 (two) times daily. 01/05/15   Jeremiah Haber, MD  gabapentin (NEURONTIN) 300 MG capsule Take 1 capsule (300 mg total) by mouth 3 (three) times daily. 11/10/14   Jeremiah Haber, MD  lisinopril (PRINIVIL,ZESTRIL) 10 MG tablet TAKE 1 TABLET(10 MG) BY MOUTH DAILY 11/28/14   Jeremiah Haber, MD  omega-3 acid ethyl esters (LOVAZA) 1 G capsule TAKE 2 CAPSULES(2 GRAMS) BY MOUTH TWICE DAILY 11/28/14   Jeremiah Haber, MD  Oxycodone HCl 10 MG TABS Take 1 tablet (10 mg total) by mouth every 8 (eight) hours as needed (pain). 02/12/15   Jeremiah Haber, MD  senna-docusate (SENOKOT-S) 8.6-50 MG per tablet Take 2 tablets by mouth. 01/03/15   Historical Provider, MD    Allergies:  Allergies  Allergen Reactions  . Acetaminophen Itching  . Hydrocodone-Acetaminophen Itching    REACTION: itching  . Vicodin [Hydrocodone-Acetaminophen]     Social History   Social History  . Marital Status: Divorced    Spouse Name: N/A  . Number of Children: N/A  . Years of Education: N/A   Occupational History  . Unemployed  Social History Main Topics  . Smoking status: Current Every Day Smoker -- 0.33 packs/day for 42 years    Types: Cigarettes  . Smokeless tobacco: Never Used     Comment: PATIENT IS TRYING TO QUIT HE SAID  . Alcohol Use: 0.0 oz/week    0 Standard drinks or equivalent per week     Comment: rarely drinks   . Drug Use: No  . Sexual Activity: No   Other Topics Concern  . Not on file   Social History Narrative   Lives in [place].     Review of Systems: Constitutional: negative for fever, chills, night sweats, weight changes, or fatigue  HEENT: negative for vision changes, hearing loss, congestion, rhinorrhea, ST, epistaxis, or sinus pressure Cardiovascular: negative for chest pain or palpitations Respiratory: negative for hemoptysis, wheezing, shortness  of breath, or cough Abdominal: negative for abdominal pain, nausea, vomiting, diarrhea, or constipation Dermatological: negative for rash Neurologic: negative for headache, dizziness, or syncope Musc: positive for hip pain (left)  All other systems reviewed and are otherwise negative with the exception to those above and in the HPI.  Physical Exam: Blood pressure 136/82, pulse 95, temperature 97.8 F (36.6 C), temperature source Oral, resp. rate 18, height 5\' 7"  (1.702 m), weight 221 lb 6.4 oz (100.426 kg), SpO2 98 %., Body mass index is 34.67 kg/(m^2). General: Well developed, well nourished, in no acute distress. Head: Normocephalic, atraumatic, eyes without discharge, sclera non-icteric, nares are without discharge. Oral cavity moist, posterior pharynx without exudate, erythema, peritonsillar abscess, or post nasal drip; eardrums appear normal Neck: Supple. No thyromegaly. Full ROM. No lymphadenopathy. Lungs: Clear bilaterally to auscultation without wheezes, rales, or rhonchi. Breathing is unlabored. Heart: RRR with S1 S2. No murmurs, rubs, or gallops appreciated. Msk:  Strength and tone normal for age. Extremities/Skin: Warm and dry. No clubbing or cyanosis. No edema. No rashes or suspicious lesions. Neuro: Alert and oriented X 3. Moves all extremities spontaneously. Gait is normal. CNII-XII grossly in tact.  SLR negative Psych:  Responds to questions appropriately with a normal affect.    ASSESSMENT AND PLAN:  59 y.o. year old male with  This chart was scribed in my presence and reviewed by me personally.    ICD-9-CM ICD-10-CM   1. Left knee pain 719.46 M25.562 Oxycodone HCl 10 MG TABS     Ambulatory referral to Pain Clinic  2. Midline low back pain, with sciatica presence unspecified 724.2 M54.5 Oxycodone HCl 10 MG TABS     Ambulatory referral to Pain Clinic  3. Left hip pain 719.45 M25.552 Oxycodone HCl 10 MG TABS     Ambulatory referral to Pain Clinic  4. Tinnitus,  unspecified laterality 388.30 H93.19      By signing my name below, I, Moises Blood, attest that this documentation has been prepared under the direction and in the presence of Jeremiah Haber, MD. Electronically Signed: Moises Blood, [place]. 03/16/2015 , 2:15 PM .  Signed, Jeremiah Haber, MD 03/16/2015 2:15 PM

## 2015-03-16 NOTE — Patient Instructions (Signed)
No baclofen for the next week. Then try restarting every other day as needed.

## 2015-04-26 DEATH — deceased

## 2016-10-06 IMAGING — MR MR LUMBAR SPINE W/O CM
4 of 5 series · 25 of 48 positions shown · non-contrast
Comparison: Lumbar MRI 08/14/2008.

CLINICAL DATA: 58-year-old male with chronic low back pain.
Intractable back pain. Eight total back surgeries, most recently 6-7
years ago. Last 2 months increased central lower back pain radiating
to the proximal left lower extremity. Subsequent encounter.

EXAM:
MRI LUMBAR SPINE WITHOUT CONTRAST
TECHNIQUE: Multiplanar, multisequence MR imaging of the lumbar spine was
performed. No intravenous contrast was administered.

[Series 3: T2 · sagittal · 4.0mm · 0.55mm/px · 6 of 14 slices shown (1 of 2)]
[im 1/14]
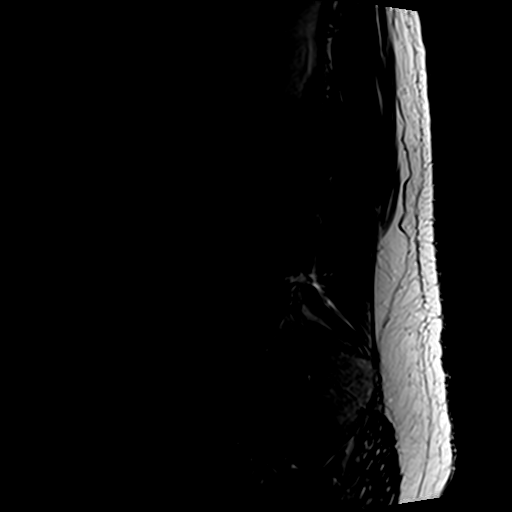
[im 3/14]
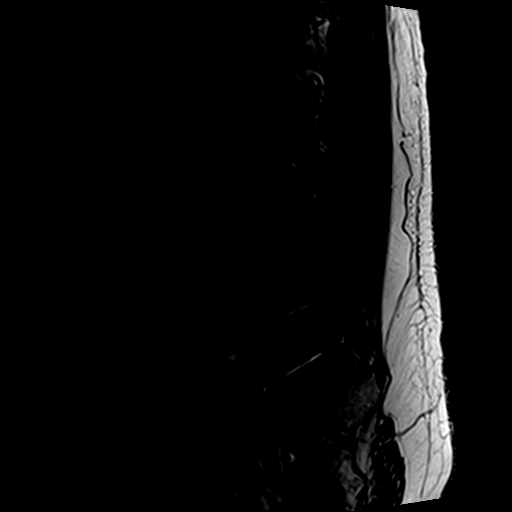
[im 6/14]
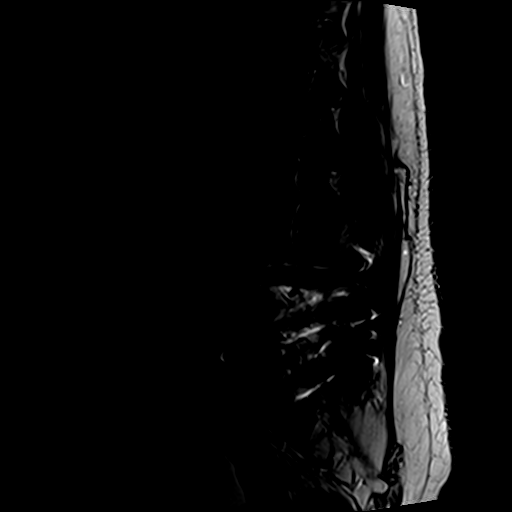
[im 8/14]
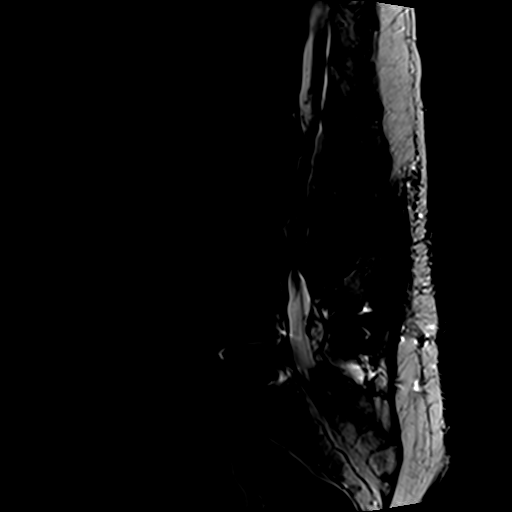
[im 11/14]
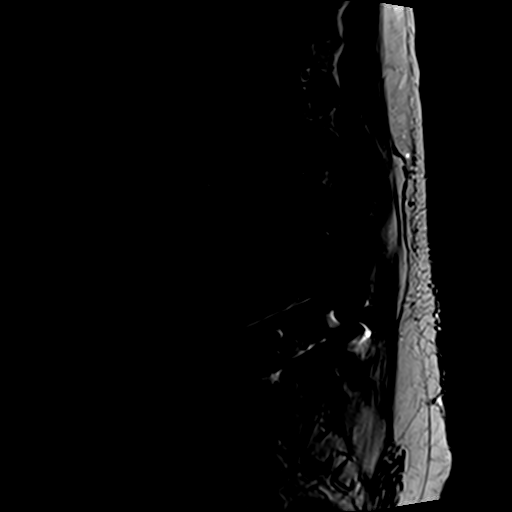
[im 14/14]
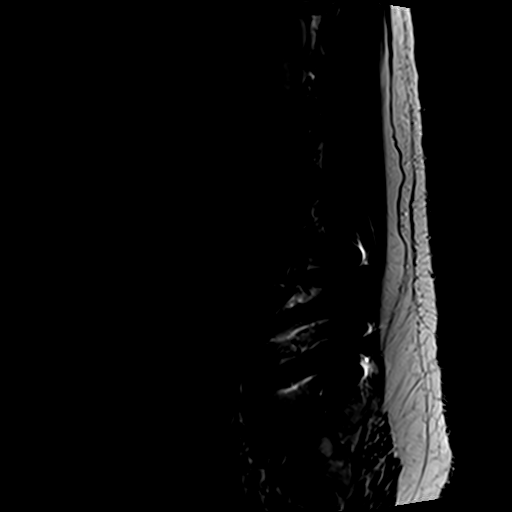

[Series 4: T1 · sagittal · 4.0mm · 0.55mm/px · 6 of 14 slices shown (1 of 2)]
[im 1/14]
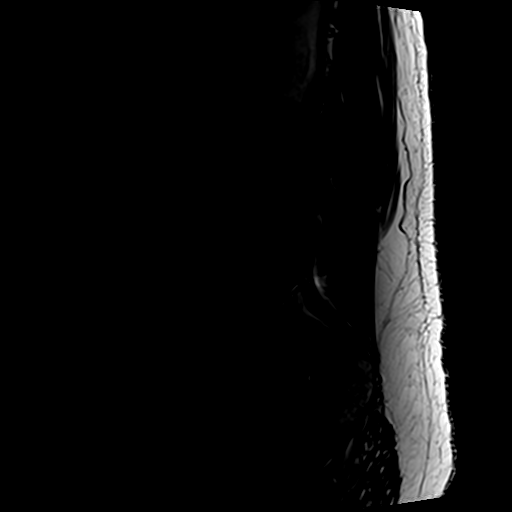
[im 3/14]
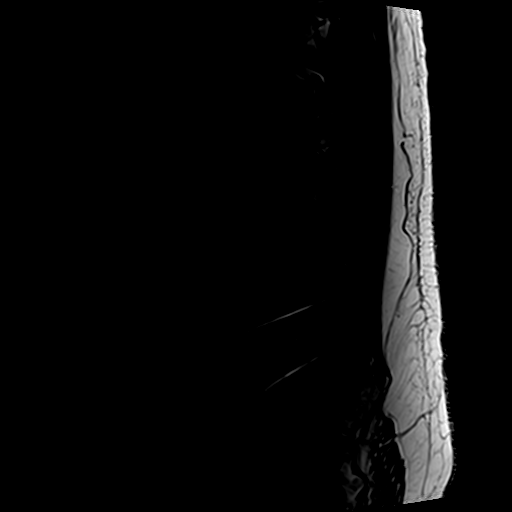
[im 6/14]
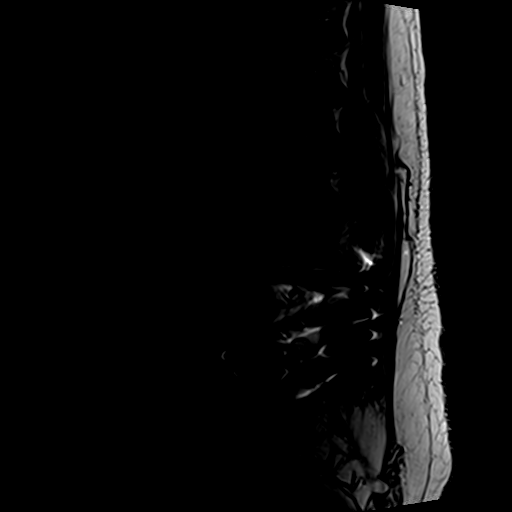
[im 8/14]
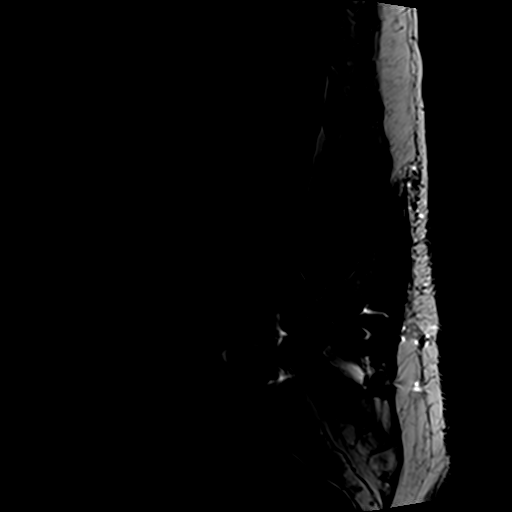
[im 11/14]
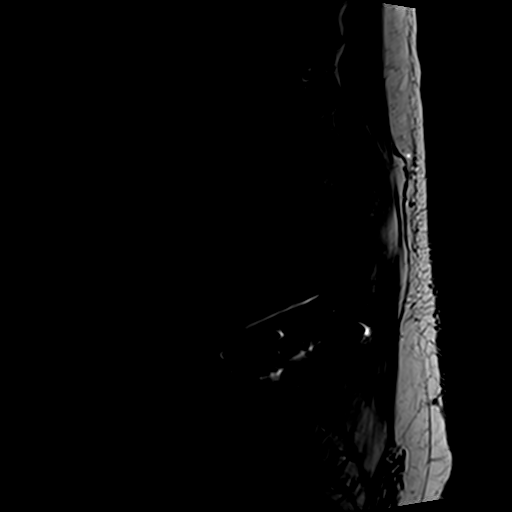
[im 14/14]
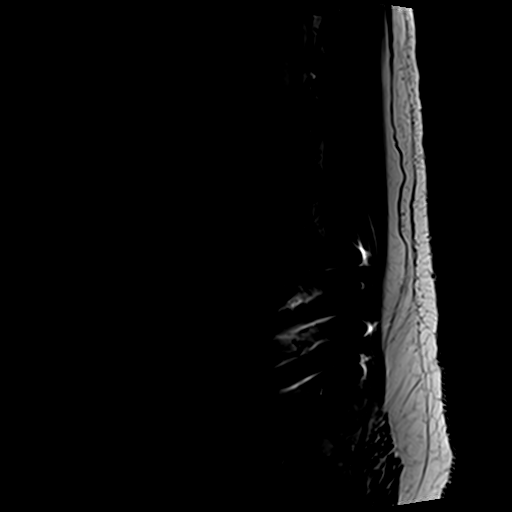

[Series 6: T2 · axial · 4.0mm · 0.70mm/px · z∈[-92,+80]mm · 9 of 33 slices shown (2 of 2)]
[im 1/33]
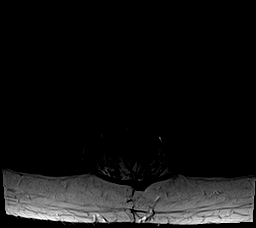
[im 5/33]
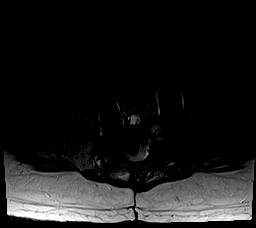
[im 10/33]
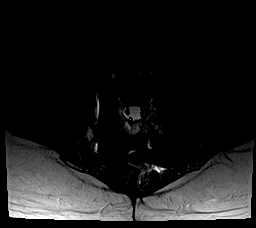
[im 14/33]
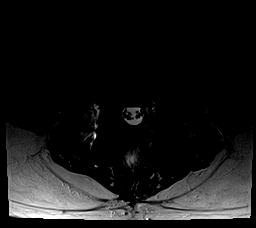
[im 17/33]
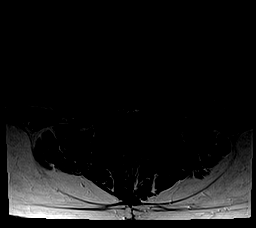
[im 19/33]
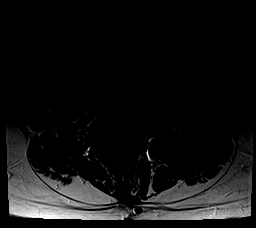
[im 23/33]
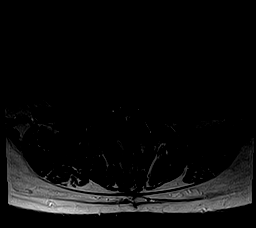
[im 28/33]
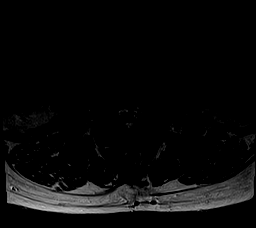
[im 33/33]
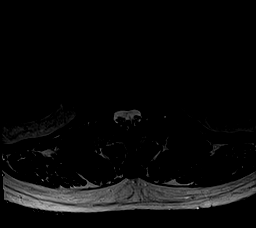

[Series 7: T1 · axial · 4.0mm · 0.35mm/px · z∈[-92,+54]mm · 4 of 33 slices shown (2 of 2)]
[im 1/33]
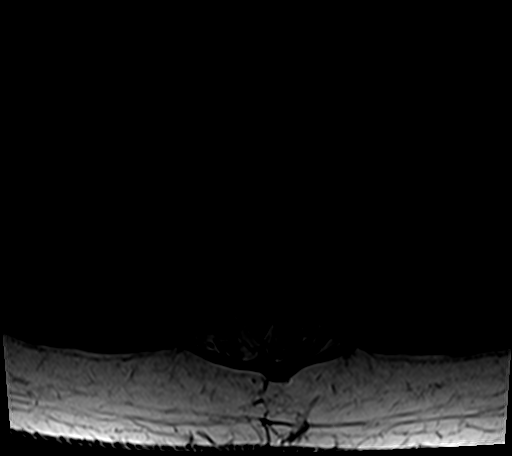
[im 5/33]
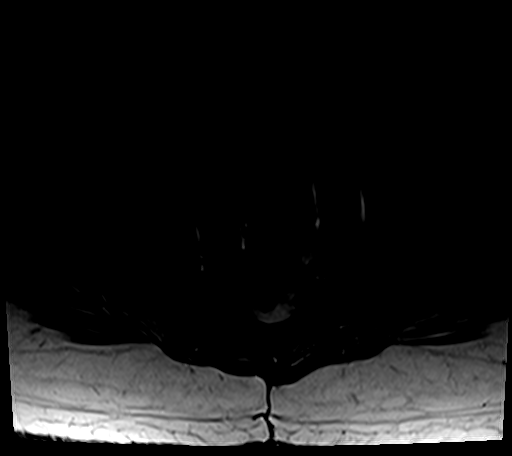
[im 17/33]
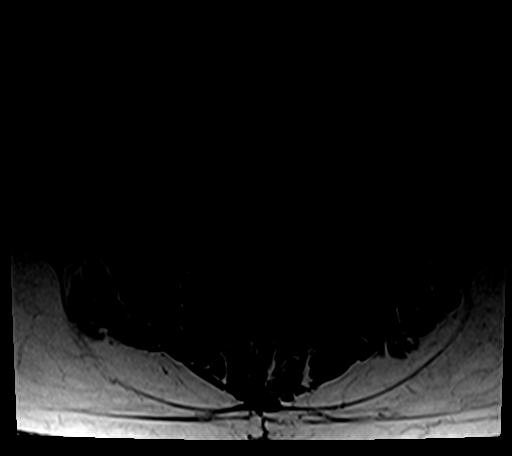
[im 28/33]
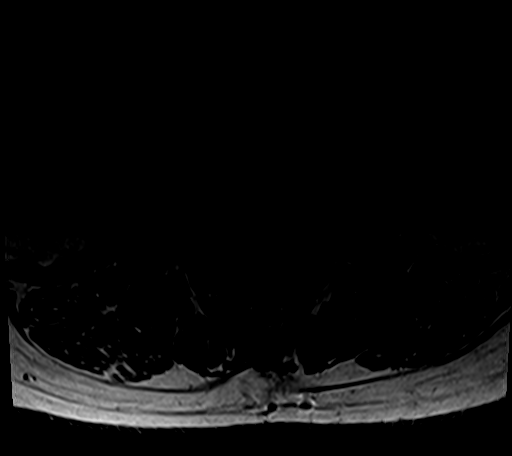

[25 of 48 positions shown; findings below may reference images not displayed]

FINDINGS: Same numbering system as on the comparison. Chronic postoperative
changes at L4-L5 and L5-S1 with susceptibility artifact related to
posterior and interbody fusion hardware. Stable vertebral height and
alignment. No marrow edema or evidence of acute osseous abnormality.

The bladder is distended today. Stable visualized abdominal viscera.

Visualized lower thoracic spinal cord is normal with conus medularis
at L1.

T11-T12:  Negative.

T12-L1:  Negative.

L1-L2: Progressed and now severe disc space loss. Chronic bulky disc
osteophyte complex eccentric to the right. Chronic epidural
lipomatosis and facet hypertrophy. Moderate spinal and L1
biforaminal stenosis are mildly increased.

L2-L3: Interval disc space loss and progressed bulky right eccentric
circumferential disc osteophyte complex. Chronic facet hypertrophy.
Trace facet joint fluid is increased. Severe spinal and moderate L2
biforaminal stenosis are increased.

L3-L4: Chronic disc space loss and bulky circumferential disc
osteophyte complex. Chronic moderate facet and ligament flavum
hypertrophy. Severe spinal stenosis does appear mildly progressed.
Moderate L3 biforaminal stenosis has not significantly changed.

L4-L5:  Stable status post decompression and fusion.

L5-S1:  Stable status post decompression and fusion.
IMPRESSION: 1. Progressed multifactorial degenerative spinal stenosis since 8929
from L1-L2 to L3-L4, severe at the latter 2 levels. Progression of
mild to moderate L1 and L2 biforaminal stenosis.
2. Stable postoperative changes L4-L5 and L5-S1.

## 2024-01-19 ENCOUNTER — Encounter: Payer: Self-pay | Admitting: Gastroenterology
